# Patient Record
Sex: Male | Born: 1975 | Race: White | Hispanic: No | Marital: Married | Smoking: Never smoker
Health system: Southern US, Community
[De-identification: ages and names within clinical notes are randomized; demographics above are authoritative.]

## PROBLEM LIST (undated history)

## (undated) DIAGNOSIS — J45909 Unspecified asthma, uncomplicated: Secondary | ICD-10-CM

## (undated) DIAGNOSIS — R7303 Prediabetes: Secondary | ICD-10-CM

## (undated) DIAGNOSIS — R112 Nausea with vomiting, unspecified: Secondary | ICD-10-CM

## (undated) DIAGNOSIS — Z9889 Other specified postprocedural states: Secondary | ICD-10-CM

## (undated) DIAGNOSIS — N400 Enlarged prostate without lower urinary tract symptoms: Secondary | ICD-10-CM

## (undated) DIAGNOSIS — J3089 Other allergic rhinitis: Secondary | ICD-10-CM

## (undated) DIAGNOSIS — W57XXXA Bitten or stung by nonvenomous insect and other nonvenomous arthropods, initial encounter: Secondary | ICD-10-CM

## (undated) HISTORY — DX: Other allergic rhinitis: J30.89

## (undated) HISTORY — DX: Benign prostatic hyperplasia without lower urinary tract symptoms: N40.0

## (undated) HISTORY — DX: Prediabetes: R73.03

## (undated) HISTORY — PX: OTHER SURGICAL HISTORY: SHX169

## (undated) HISTORY — DX: Bitten or stung by nonvenomous insect and other nonvenomous arthropods, initial encounter: W57.XXXA

## (undated) HISTORY — DX: Unspecified asthma, uncomplicated: J45.909

## (undated) HISTORY — PX: SEPTOPLASTY: SHX2393

---

## 1996-08-10 HISTORY — PX: GANGLION CYST EXCISION: SHX1691

## 2001-02-21 ENCOUNTER — Emergency Department (HOSPITAL_COMMUNITY): Admission: EM | Admit: 2001-02-21 | Discharge: 2001-02-21 | Payer: Self-pay | Admitting: Emergency Medicine

## 2002-02-08 ENCOUNTER — Ambulatory Visit (HOSPITAL_COMMUNITY): Admission: RE | Admit: 2002-02-08 | Discharge: 2002-02-08 | Payer: Self-pay | Admitting: Specialist

## 2002-02-08 ENCOUNTER — Encounter: Payer: Self-pay | Admitting: Orthopaedic Surgery

## 2005-05-02 ENCOUNTER — Emergency Department (HOSPITAL_COMMUNITY): Admission: EM | Admit: 2005-05-02 | Discharge: 2005-05-02 | Payer: Self-pay | Admitting: Emergency Medicine

## 2005-06-02 ENCOUNTER — Ambulatory Visit: Payer: Self-pay | Admitting: Internal Medicine

## 2005-11-30 ENCOUNTER — Ambulatory Visit: Payer: Self-pay | Admitting: Internal Medicine

## 2006-06-03 ENCOUNTER — Ambulatory Visit: Payer: Self-pay | Admitting: Family Medicine

## 2006-06-03 ENCOUNTER — Encounter: Admission: RE | Admit: 2006-06-03 | Discharge: 2006-06-03 | Payer: Self-pay | Admitting: Family Medicine

## 2006-06-22 ENCOUNTER — Encounter: Admission: RE | Admit: 2006-06-22 | Discharge: 2006-06-22 | Payer: Self-pay | Admitting: Family Medicine

## 2007-06-20 ENCOUNTER — Ambulatory Visit: Payer: Self-pay | Admitting: Family Medicine

## 2007-06-20 DIAGNOSIS — S139XXA Sprain of joints and ligaments of unspecified parts of neck, initial encounter: Secondary | ICD-10-CM | POA: Insufficient documentation

## 2007-06-20 DIAGNOSIS — IMO0002 Reserved for concepts with insufficient information to code with codable children: Secondary | ICD-10-CM | POA: Insufficient documentation

## 2007-07-11 ENCOUNTER — Telehealth (INDEPENDENT_AMBULATORY_CARE_PROVIDER_SITE_OTHER): Payer: Self-pay | Admitting: *Deleted

## 2007-08-02 ENCOUNTER — Encounter (INDEPENDENT_AMBULATORY_CARE_PROVIDER_SITE_OTHER): Payer: Self-pay | Admitting: Family Medicine

## 2007-08-16 ENCOUNTER — Encounter: Admission: RE | Admit: 2007-08-16 | Discharge: 2007-08-16 | Payer: Self-pay | Admitting: Family Medicine

## 2007-08-18 ENCOUNTER — Telehealth (INDEPENDENT_AMBULATORY_CARE_PROVIDER_SITE_OTHER): Payer: Self-pay | Admitting: *Deleted

## 2007-08-26 ENCOUNTER — Encounter: Admission: RE | Admit: 2007-08-26 | Discharge: 2007-08-26 | Payer: Self-pay | Admitting: Family Medicine

## 2007-08-31 ENCOUNTER — Telehealth (INDEPENDENT_AMBULATORY_CARE_PROVIDER_SITE_OTHER): Payer: Self-pay | Admitting: *Deleted

## 2007-11-30 ENCOUNTER — Encounter: Admission: RE | Admit: 2007-11-30 | Discharge: 2007-11-30 | Payer: Self-pay | Admitting: Orthopedic Surgery

## 2008-11-08 DIAGNOSIS — W57XXXA Bitten or stung by nonvenomous insect and other nonvenomous arthropods, initial encounter: Secondary | ICD-10-CM

## 2008-11-08 HISTORY — DX: Bitten or stung by nonvenomous insect and other nonvenomous arthropods, initial encounter: W57.XXXA

## 2008-11-30 ENCOUNTER — Ambulatory Visit: Payer: Self-pay | Admitting: Family Medicine

## 2008-12-07 ENCOUNTER — Telehealth (INDEPENDENT_AMBULATORY_CARE_PROVIDER_SITE_OTHER): Payer: Self-pay | Admitting: *Deleted

## 2008-12-11 ENCOUNTER — Ambulatory Visit: Payer: Self-pay | Admitting: Family Medicine

## 2008-12-11 DIAGNOSIS — R209 Unspecified disturbances of skin sensation: Secondary | ICD-10-CM | POA: Insufficient documentation

## 2008-12-11 LAB — CONVERTED CEMR LAB
WBC Urine, dipstick: NEGATIVE
pH: 7.5

## 2008-12-14 ENCOUNTER — Ambulatory Visit: Payer: Self-pay | Admitting: Family Medicine

## 2008-12-18 ENCOUNTER — Encounter (INDEPENDENT_AMBULATORY_CARE_PROVIDER_SITE_OTHER): Payer: Self-pay | Admitting: *Deleted

## 2008-12-18 LAB — CONVERTED CEMR LAB
AST: 27 units/L (ref 0–37)
Alkaline Phosphatase: 46 units/L (ref 39–117)
BUN: 16 mg/dL (ref 6–23)
Basophils Absolute: 0 10*3/uL (ref 0.0–0.1)
Basophils Relative: 0.1 % (ref 0.0–3.0)
Bilirubin, Direct: 0 mg/dL (ref 0.0–0.3)
CO2: 31 meq/L (ref 19–32)
Calcium: 9.5 mg/dL (ref 8.4–10.5)
Chloride: 109 meq/L (ref 96–112)
Cholesterol: 144 mg/dL (ref 0–200)
Creatinine, Ser: 1.2 mg/dL (ref 0.4–1.5)
Eosinophils Relative: 6.6 % — ABNORMAL HIGH (ref 0.0–5.0)
GFR calc non Af Amer: 73.18 mL/min (ref >60)
Glucose, Bld: 105 mg/dL — ABNORMAL HIGH (ref 70–99)
Lymphs Abs: 1.3 10*3/uL (ref 0.7–4.0)
Monocytes Absolute: 0.5 10*3/uL (ref 0.1–1.0)
Monocytes Relative: 9 % (ref 3.0–12.0)
Neutro Abs: 3.5 10*3/uL (ref 1.4–7.7)
Neutrophils Relative %: 60.8 % (ref 43.0–77.0)
RBC: 5.28 M/uL (ref 4.22–5.81)
RDW: 11.9 % (ref 11.5–14.6)
Total Bilirubin: 1.3 mg/dL — ABNORMAL HIGH (ref 0.3–1.2)
Triglycerides: 58 mg/dL (ref 0.0–149.0)
VLDL: 11.6 mg/dL (ref 0.0–40.0)

## 2009-02-27 ENCOUNTER — Ambulatory Visit: Payer: Self-pay | Admitting: Internal Medicine

## 2009-03-20 ENCOUNTER — Ambulatory Visit: Payer: Self-pay | Admitting: Family Medicine

## 2009-03-20 DIAGNOSIS — M25519 Pain in unspecified shoulder: Secondary | ICD-10-CM

## 2009-07-03 ENCOUNTER — Ambulatory Visit: Payer: Self-pay | Admitting: Family

## 2009-07-03 DIAGNOSIS — J019 Acute sinusitis, unspecified: Secondary | ICD-10-CM

## 2009-10-03 ENCOUNTER — Telehealth (INDEPENDENT_AMBULATORY_CARE_PROVIDER_SITE_OTHER): Payer: Self-pay | Admitting: *Deleted

## 2009-10-03 ENCOUNTER — Ambulatory Visit (HOSPITAL_BASED_OUTPATIENT_CLINIC_OR_DEPARTMENT_OTHER): Admission: RE | Admit: 2009-10-03 | Discharge: 2009-10-03 | Payer: Self-pay | Admitting: Family Medicine

## 2009-10-03 ENCOUNTER — Ambulatory Visit: Payer: Self-pay | Admitting: Family Medicine

## 2009-10-03 ENCOUNTER — Ambulatory Visit: Payer: Self-pay | Admitting: Diagnostic Radiology

## 2009-10-03 DIAGNOSIS — L02519 Cutaneous abscess of unspecified hand: Secondary | ICD-10-CM

## 2009-10-03 DIAGNOSIS — L03119 Cellulitis of unspecified part of limb: Secondary | ICD-10-CM

## 2009-12-02 ENCOUNTER — Encounter: Payer: Self-pay | Admitting: Family Medicine

## 2009-12-02 ENCOUNTER — Encounter: Payer: Self-pay | Admitting: Infectious Diseases

## 2009-12-30 ENCOUNTER — Encounter: Admission: RE | Admit: 2009-12-30 | Discharge: 2009-12-30 | Payer: Self-pay | Admitting: Occupational Medicine

## 2009-12-30 ENCOUNTER — Ambulatory Visit (HOSPITAL_COMMUNITY): Admission: RE | Admit: 2009-12-30 | Discharge: 2009-12-30 | Payer: Self-pay | Admitting: Infectious Disease

## 2009-12-30 ENCOUNTER — Ambulatory Visit: Payer: Self-pay | Admitting: Infectious Diseases

## 2009-12-30 LAB — CONVERTED CEMR LAB
CRP: 0 mg/dL (ref <0.6)
Eosinophils Absolute: 0.2 10*3/uL (ref 0.0–0.7)
Eosinophils Relative: 2 % (ref 0–5)
HCT: 43.7 % (ref 39.0–52.0)
MCHC: 33.4 g/dL (ref 30.0–36.0)
MCV: 85.5 fL (ref 78.0–100.0)
Neutro Abs: 5.4 10*3/uL (ref 1.7–7.7)
Neutrophils Relative %: 70 % (ref 43–77)
RDW: 13.1 % (ref 11.5–15.5)
Sed Rate: 2 mm/hr (ref 0–16)

## 2010-08-31 ENCOUNTER — Encounter: Payer: Self-pay | Admitting: Orthopedic Surgery

## 2010-09-09 NOTE — Miscellaneous (Signed)
Summary: HIPAA Restrictions  HIPAA Restrictions   Imported By: Florinda Marker 12/31/2009 13:49:21  _____________________________________________________________________  External Attachment:    Type:   Image     Comment:   External Document

## 2010-09-09 NOTE — Consult Note (Signed)
Summary: [facility]  [facility]   Imported By: Lanelle Bal 12/17/2009 07:58:41  _____________________________________________________________________  External Attachment:    Type:   Image     Comment:   External Document
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
## 2010-09-09 NOTE — Progress Notes (Signed)
Summary: Imaging Results  Phone Note Outgoing Call   Call placed by: Army Fossa CMA,  October 03, 2009 5:11 PM Summary of Call: Imaging results,LMTCB:  foreign body still in hand---  referral for hand surgeon pending   Signed by Loreen Freud DO on 10/03/2009 at 5:08 PM  Follow-up for Phone Call        Pts aware. Army Fossa CMA  October 09, 2009 2:11 PM

## 2010-09-09 NOTE — Consult Note (Signed)
Summary: [facility]   Imported By: Florinda Marker 01/01/2010 14:01:48  _____________________________________________________________________  External Attachment:    Type:   Image     Comment:   External Document
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
## 2010-11-18 ENCOUNTER — Encounter: Payer: Self-pay | Admitting: *Deleted

## 2011-06-18 ENCOUNTER — Encounter: Payer: Self-pay | Admitting: Family Medicine

## 2011-06-19 ENCOUNTER — Ambulatory Visit (INDEPENDENT_AMBULATORY_CARE_PROVIDER_SITE_OTHER): Payer: 59 | Admitting: Family Medicine

## 2011-06-19 ENCOUNTER — Encounter: Payer: Self-pay | Admitting: Family Medicine

## 2011-06-19 DIAGNOSIS — R350 Frequency of micturition: Secondary | ICD-10-CM | POA: Insufficient documentation

## 2011-06-19 DIAGNOSIS — I10 Essential (primary) hypertension: Secondary | ICD-10-CM | POA: Insufficient documentation

## 2011-06-19 LAB — CBC WITH DIFFERENTIAL/PLATELET
Basophils Absolute: 0 10*3/uL (ref 0.0–0.1)
HCT: 45 % (ref 39.0–52.0)
Lymphs Abs: 1.7 10*3/uL (ref 0.7–4.0)
MCH: 29.8 pg (ref 26.0–34.0)
MCHC: 34.9 g/dL (ref 30.0–36.0)
MCV: 85.4 fL (ref 78.0–100.0)
Monocytes Absolute: 0.7 10*3/uL (ref 0.1–1.0)
Monocytes Relative: 8 % (ref 3–12)
Neutrophils Relative %: 65 % (ref 43–77)
RDW: 12.8 % (ref 11.5–15.5)

## 2011-06-19 LAB — TSH: TSH: 1.132 u[IU]/mL (ref 0.350–4.500)

## 2011-06-19 LAB — BASIC METABOLIC PANEL
Creat: 1.07 mg/dL (ref 0.50–1.35)
Potassium: 4 mEq/L (ref 3.5–5.3)
Sodium: 142 mEq/L (ref 135–145)

## 2011-06-19 MED ORDER — HYDROCHLOROTHIAZIDE 12.5 MG PO TABS: 12.5000 mg | ORAL_TABLET | Freq: Every day | ORAL | Status: DC

## 2011-06-19 NOTE — Progress Notes (Signed)
  Subjective:    Patient ID: Alexander Hernandez, male    DOB: [birth year], 35 y.o.   MRN: [id number]  HPI Elevated BP- highest SBP 154, highest DBP 102.  Reports pressure has been intermittently elevated x2 weeks.  Has stopped taking energy drinks x1 week.  Frequent HAs.  No CP, SOB, visual changes, edema.  + family hx- dad.  MGM died of MI at 64.  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    Urinary frequency- going often and small amounts.  + family hx of BPH.  sxs started 'a couple years ago'.  Urinating 2-4x/night.   Review of Systems For ROS see HPI     Objective:   Physical Exam  Vitals reviewed. Constitutional: He is oriented to person, place, and time. He appears well-developed and well-nourished. No distress.  HENT:  Head: Normocephalic and atraumatic.  Eyes: Conjunctivae and EOM are normal. Pupils are equal, round, and reactive to light.  Neck: Normal range of motion. Neck supple. No thyromegaly present.  Cardiovascular: Normal rate, regular rhythm, normal heart sounds and intact distal pulses.   No murmur heard. Pulmonary/Chest: Effort normal and breath sounds normal. No respiratory distress.  Abdominal: Soft. Bowel sounds are normal. He exhibits no distension.  Musculoskeletal: He exhibits no edema.  Lymphadenopathy:    He has no cervical adenopathy.  Neurological: He is alert and oriented to person, place, and time. No cranial nerve deficit.  Skin: Skin is warm and dry.  Psychiatric: He has a normal mood and affect. His behavior is normal.          Assessment & Plan:

## 2011-06-19 NOTE — Patient Instructions (Signed)
Follow up in 1 month to recheck BP Try and limit amount of sodium and caffeine in diet SLEEP!!!! Start the HCTZ daily We'll notify you of your lab results Someone will call you with your urology appt Sherri Rad in there!!!

## 2011-06-22 ENCOUNTER — Ambulatory Visit (INDEPENDENT_AMBULATORY_CARE_PROVIDER_SITE_OTHER): Payer: 59

## 2011-06-22 ENCOUNTER — Encounter: Payer: Self-pay | Admitting: *Deleted

## 2011-06-22 DIAGNOSIS — Z23 Encounter for immunization: Secondary | ICD-10-CM

## 2011-06-28 NOTE — Assessment & Plan Note (Signed)
May be related to excessive caffeine intake but given young age and nocturia will refer to Urology for complete evaluation.  Pt expressed understanding and is in agreement w/ plan.

## 2011-06-28 NOTE — Assessment & Plan Note (Signed)
Pt's BP has been elevated for >2 weeks w/ diastolic #s often in the 90s.  Check labs to r/o thyroid d/o, anemia, metabolic abnormality.  Based on family hx will start HCTZ and follow closely.  Reviewed lifestyle modifications.  Pt expressed understanding and is in agreement w/ plan.

## 2011-07-03 ENCOUNTER — Encounter: Payer: Self-pay | Admitting: Family Medicine

## 2011-07-20 ENCOUNTER — Encounter: Payer: Self-pay | Admitting: Family Medicine

## 2011-07-20 ENCOUNTER — Ambulatory Visit (INDEPENDENT_AMBULATORY_CARE_PROVIDER_SITE_OTHER): Payer: 59 | Admitting: Family Medicine

## 2011-07-20 DIAGNOSIS — J029 Acute pharyngitis, unspecified: Secondary | ICD-10-CM

## 2011-07-20 DIAGNOSIS — B353 Tinea pedis: Secondary | ICD-10-CM

## 2011-07-20 DIAGNOSIS — I10 Essential (primary) hypertension: Secondary | ICD-10-CM

## 2011-07-20 LAB — POCT RAPID STREP A (OFFICE): Rapid Strep A Screen: NEGATIVE

## 2011-07-20 NOTE — Assessment & Plan Note (Signed)
Start OTC Clotrimazole bid.  Encouraged him to keep feet clean and dry.  Reviewed supportive care and red flags that should prompt return.  Pt expressed understanding and is in agreement w/ plan.

## 2011-07-20 NOTE — Assessment & Plan Note (Signed)
New problem.  Pt tolerating meds w/out difficulty.  BP better controlled today.  Has made some lifestyle changes.  Check BMP.  Continue current meds for now.  Reviewed supportive care and red flags that should prompt return.  Pt expressed understanding and is in agreement w/ plan.

## 2011-07-20 NOTE — Progress Notes (Signed)
  Subjective:    Patient ID: Alexander Hernandez, male    DOB: 1976/06/14, 35 y.o.   MRN: [id number]  HPI HTN- new problem.  Started HCTZ at last visit.  BP much better today.  Increased sleep.  Notes increased BP w/ stress and lack of sleep.  No CP, SOB, HAs, visual changes, edema.  URI- sxs started 1 week ago w/ sore throat.  + nasal congestion, PND.  No ear pain.  Denies facial pain/pressure.  Minimal cough.  + sick contacts.  No fevers.  Fungal infxn- on feet during winter yearly.  Wears boots as a Company secretary.  Started changing socks mid day and this helps.  Wants to know what else can be done.   Review of Systems For ROS see HPI     Objective:   Physical Exam  Constitutional: He is oriented to person, place, and time. He appears well-developed and well-nourished. No distress.  HENT:  Head: Normocephalic and atraumatic.  Nose: Nose normal.       TMs normal bilaterally Pharynx erythematous w/ small patch of exudate No tonsillar enlargement  Eyes: Conjunctivae and EOM are normal. Pupils are equal, round, and reactive to light.  Neck: Normal range of motion. Neck supple. No thyromegaly present.  Cardiovascular: Normal rate, regular rhythm, normal heart sounds and intact distal pulses.   No murmur heard. Pulmonary/Chest: Effort normal and breath sounds normal. No respiratory distress.  Abdominal: Soft. Bowel sounds are normal. He exhibits no distension.  Musculoskeletal: He exhibits no edema.  Lymphadenopathy:    He has no cervical adenopathy.  Neurological: He is alert and oriented to person, place, and time. No cranial nerve deficit.  Skin: Skin is warm and dry.  Psychiatric: He has a normal mood and affect. His behavior is normal.          Assessment & Plan:

## 2011-07-20 NOTE — Patient Instructions (Signed)
Follow up in 3 months to discuss whether you need to continue BP meds Continue the HCTZ daily We'll notify you of your lab results Drink plenty of fluids Your strep test is negative so this is likely viral Ibuprofen as needed for sore throat Call with any questions or concerns Happy Holidays!

## 2011-07-20 NOTE — Assessment & Plan Note (Signed)
Rapid strep (-).  Most likely viral.  Reviewed supportive care and red flags that should prompt return.  Pt expressed understanding and is in agreement w/ plan.  

## 2011-07-21 LAB — BASIC METABOLIC PANEL
Calcium: 9.4 mg/dL (ref 8.4–10.5)
Glucose, Bld: 82 mg/dL (ref 70–99)
Potassium: 4.2 mEq/L (ref 3.5–5.1)

## 2011-07-30 ENCOUNTER — Encounter: Payer: Self-pay | Admitting: *Deleted

## 2011-11-07 ENCOUNTER — Encounter (HOSPITAL_COMMUNITY): Payer: Self-pay

## 2011-11-07 ENCOUNTER — Emergency Department (HOSPITAL_COMMUNITY)
Admission: EM | Admit: 2011-11-07 | Discharge: 2011-11-07 | Disposition: A | Discharge status: Nursing Home (Includes ICF) | Payer: 59 | Source: Home / Self Care | Attending: Family Medicine | Admitting: Family Medicine

## 2011-11-07 ENCOUNTER — Emergency Department (HOSPITAL_COMMUNITY): Payer: 59

## 2011-11-07 ENCOUNTER — Encounter (HOSPITAL_COMMUNITY): Payer: Self-pay | Admitting: Emergency Medicine

## 2011-11-07 ENCOUNTER — Emergency Department (HOSPITAL_COMMUNITY)
Admission: EM | Admit: 2011-11-07 | Discharge: 2011-11-07 | Disposition: A | Discharge status: Home / Self Care | Payer: 59 | Attending: Emergency Medicine | Admitting: Emergency Medicine

## 2011-11-07 DIAGNOSIS — Z23 Encounter for immunization: Secondary | ICD-10-CM | POA: Insufficient documentation

## 2011-11-07 DIAGNOSIS — F17200 Nicotine dependence, unspecified, uncomplicated: Secondary | ICD-10-CM | POA: Insufficient documentation

## 2011-11-07 DIAGNOSIS — W11XXXA Fall on and from ladder, initial encounter: Secondary | ICD-10-CM | POA: Insufficient documentation

## 2011-11-07 DIAGNOSIS — S01511A Laceration without foreign body of lip, initial encounter: Secondary | ICD-10-CM

## 2011-11-07 DIAGNOSIS — W19XXXA Unspecified fall, initial encounter: Secondary | ICD-10-CM

## 2011-11-07 DIAGNOSIS — S01501A Unspecified open wound of lip, initial encounter: Secondary | ICD-10-CM | POA: Insufficient documentation

## 2011-11-07 DIAGNOSIS — Y92009 Unspecified place in unspecified non-institutional (private) residence as the place of occurrence of the external cause: Secondary | ICD-10-CM | POA: Insufficient documentation

## 2011-11-07 DIAGNOSIS — M264 Malocclusion, unspecified: Secondary | ICD-10-CM

## 2011-11-07 DIAGNOSIS — S025XXA Fracture of tooth (traumatic), initial encounter for closed fracture: Secondary | ICD-10-CM

## 2011-11-07 DIAGNOSIS — R7309 Other abnormal glucose: Secondary | ICD-10-CM | POA: Insufficient documentation

## 2011-11-07 MED ORDER — DIPHENHYDRAMINE HCL 25 MG PO CAPS
25.0000 mg | ORAL_CAPSULE | Freq: Once | ORAL | Status: AC
  Administered 2011-11-07: 25 mg via ORAL
  Filled 2011-11-07: qty 1

## 2011-11-07 MED ORDER — TETANUS-DIPHTH-ACELL PERTUSSIS 5-2.5-18.5 LF-MCG/0.5 IM SUSP: 0.5000 mL | Freq: Once | INTRAMUSCULAR | Status: DC

## 2011-11-07 MED ORDER — AMOXICILLIN 500 MG PO CAPS: 500.0000 mg | ORAL_CAPSULE | Freq: Three times a day (TID) | ORAL | Status: AC

## 2011-11-07 MED ORDER — IBUPROFEN 800 MG PO TABS: 800.0000 mg | ORAL_TABLET | Freq: Four times a day (QID) | ORAL | Status: DC | PRN

## 2011-11-07 MED ORDER — TRAMADOL HCL 50 MG PO TABS
50.0000 mg | ORAL_TABLET | Freq: Once | ORAL | Status: AC
  Administered 2011-11-07: 50 mg via ORAL
  Filled 2011-11-07: qty 1

## 2011-11-07 MED ORDER — ACETAMINOPHEN 325 MG PO TABS
975.0000 mg | ORAL_TABLET | Freq: Once | ORAL | Status: AC
  Administered 2011-11-07: 975 mg via ORAL
  Filled 2011-11-07: qty 3

## 2011-11-07 MED ORDER — TETANUS-DIPHTH-ACELL PERTUSSIS 5-2.5-18.5 LF-MCG/0.5 IM SUSP
0.5000 mL | Freq: Once | INTRAMUSCULAR | Status: AC
  Administered 2011-11-07: 0.5 mL via INTRAMUSCULAR
  Filled 2011-11-07: qty 0.5
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
## 2011-11-07 NOTE — ED Notes (Signed)
PT. PRESENTS WITH LOWER LIP LACERATION , FELL FROM LADDER THIS AFTERNOON , DENIES LOC , AMBULATORY , ALSO STATES HARD TO CLOSE MOUTH/  UNSURE IF HE BROKE ANY TEETH.

## 2011-11-07 NOTE — ED Notes (Signed)
Pt fell 6 feet from a ladder one hour ago and landed on face, laceration to lower lip, chipped upper tooth and unable to close mouth d/t repositioning of teeth.  No loc.

## 2011-11-07 NOTE — Discharge Instructions (Signed)
Please call the dentist listed above first thing Monday morning to schedule a close follow up appointment.  Take tylenol and ibuprofen as needed for pain.  Please keep your lip clean and dry.  Take the antibiotic to prevent infection.  Please see your doctor, the urgent care, or return to the ER in 3-5 days for a wound recheck and suture removal.   Return to the ER immediately if you develop redness, swelling, pus draining from the wound, or fevers greater than 100.4.  You may return to the ER at any time for worsening condition or any new symptoms that concern you.   Dental Fracture You have a dental fracture or injury. This can mean the tooth is loose, has a chip in the enamel or is broken. If just the outer enamel is chipped, there is a good chance the tooth will not become infected. The only treatment needed may be to smooth off a rough edge. Fractures into the deeper layers (dentin and pulp) cause greater pain and are more likely to become infected. These require you to see a dentist as soon as possible to save the tooth. Loose teeth may need to be wired or bonded with a plastic splint to hold them in place. A paste may be painted on the open area of the broken tooth to reduce the pain. Antibiotics and pain medicine may be prescribed. Choosing a soft or liquid diet and rinsing the mouth out with warm water after meals may be helpful. See your dentist as recommended. Failure to seek care or follow up with a dentist or other specialist as recommended could result in the loss of your tooth, infection, or permanent dental problems. SEEK MEDICAL CARE IF:   You have increased pain not controlled with medicines.   You have swelling around the tooth, in the face or neck.   You have bleeding which starts, continues, or gets worse.   You have a fever.  Document Released: 09/03/2004 Document Revised: 07/16/2011 Document Reviewed: 06/18/2009 [facility] Patient Information 2012 [place], [place].  Facial  Laceration A facial laceration is a cut on the face. Lacerations usually heal quickly, but they need special care to reduce scarring. It will take 1 to 2 years for the scar to lose its redness and to heal completely. TREATMENT  Some facial lacerations may not require closure. Some lacerations may not be able to be closed due to an increased risk of infection. It is important to see your caregiver as soon as possible after an injury to minimize the risk of infection and to maximize the opportunity for successful closure. If closure is appropriate, pain medicines may be given, if needed. The wound will be cleaned to help prevent infection. Your caregiver will use stitches (sutures), staples, wound glue (adhesive), or skin adhesive strips to repair the laceration. These tools bring the skin edges together to allow for faster healing and a better cosmetic outcome. However, all wounds will heal with a scar.  Once the wound has healed, scarring can be minimized by covering the wound with sunscreen during the day for 1 full year. Use a sunscreen with an SPF of at least 30. Sunscreen helps to reduce the pigment that will form in the scar. When applying sunscreen to a completely healed wound, massage the scar for a few minutes to help reduce the appearance of the scar. Use circular motions with your fingertips, on and around the scar. Do not massage a healing wound. HOME CARE INSTRUCTIONS For sutures:  Keep the wound clean and dry.   If you were given a bandage (dressing), you should change it at least once a day. Also change the dressing if it becomes wet or dirty, or as directed by your caregiver.   Wash the wound with soap and water 2 times a day. Rinse the wound off with water to remove all soap. Pat the wound dry with a clean towel.   After cleaning, apply a thin layer of the antibiotic ointment recommended by your caregiver. This will help prevent infection and keep the dressing from sticking.   You  may shower as usual after the first 24 hours. Do not soak the wound in water until the sutures are removed.   Only take over-the-counter or prescription medicines for pain, discomfort, or fever as directed by your caregiver.   Get your sutures removed as directed by your caregiver. With facial lacerations, sutures should usually be taken out after 4 to 5 days to avoid stitch marks.   Wait a few days after your sutures are removed before applying makeup.  For skin adhesive strips:  Keep the wound clean and dry.   Do not get the skin adhesive strips wet. You may bathe carefully, using caution to keep the wound dry.   If the wound gets wet, pat it dry with a clean towel.   Skin adhesive strips will fall off on their own. You may trim the strips as the wound heals. Do not remove skin adhesive strips that are still stuck to the wound. They will fall off in time.  For wound adhesive:  You may briefly wet your wound in the shower or bath. Do not soak or scrub the wound. Do not swim. Avoid periods of heavy perspiration until the skin adhesive has fallen off on its own. After showering or bathing, gently pat the wound dry with a clean towel.   Do not apply liquid medicine, cream medicine, ointment medicine, or makeup to your wound while the skin adhesive is in place. This may loosen the film before your wound is healed.   If a dressing is placed over the wound, be careful not to apply tape directly over the skin adhesive. This may cause the adhesive to be pulled off before the wound is healed.   Avoid prolonged exposure to sunlight or tanning lamps while the skin adhesive is in place. Exposure to ultraviolet light in the first year will darken the scar.   The skin adhesive will usually remain in place for 5 to 10 days, then naturally fall off the skin. Do not pick at the adhesive film.  You may need a tetanus shot if:  You cannot remember when you had your last tetanus shot.   You have never  had a tetanus shot.  If you get a tetanus shot, your arm may swell, get red, and feel warm to the touch. This is common and not a problem. If you need a tetanus shot and you choose not to have one, there is a rare chance of getting tetanus. Sickness from tetanus can be serious. SEEK IMMEDIATE MEDICAL CARE IF:  You develop redness, pain, or swelling around the wound.   There is yellowish-white fluid (pus) coming from the wound.   You develop chills or a fever.  MAKE SURE YOU:  Understand these instructions.   Will watch your condition.   Will get help right away if you are not doing well or get worse.  Document Released: 09/03/2004 Document  Revised: 07/16/2011 Document Reviewed: 01/19/2011 [facility] Patient Information 2012 [place], [place].  Mouth Injury Cuts and scrapes inside the mouth are common from falls or bites. They tend to bleed a lot. Most mouth injuries heal quickly.  HOME CARE  See your dentist right away if teeth are broken. Take all broken pieces with you to the dentist.   Press on the bleeding site with a germ free (sterile) gauze or piece of clean cloth. This will help stop the bleeding.   Cold drinks or ice will help keep the puffiness (swelling) down.   Gargle with warm salt water after 1 day. Put 1 teaspoon of salt into 1 cup of warm water.   Only take medicine as told by your doctor.   Eat soft foods until healing is complete.   Avoid any salty or citrus foods. They may sting your mouth.   Rinse your mouth with warm water after meals.  GET HELP RIGHT AWAY IF:   You have a large amount of bleeding that will not stop.   You have severe pain.   You have trouble swallowing.   Your mouth becomes infected.   You have a fever.  MAKE SURE YOU:   Understand these instructions.   Will watch your condition.   Will get help right away if you are not doing well or get worse.  Document Released: 10/21/2009 Document Revised: 07/16/2011 Document Reviewed:  10/21/2009 [facility] Patient Information 2012 [place], [place].
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
## 2011-11-07 NOTE — ED Notes (Signed)
Suture cart moved to room. Provided pt with sterile gauze soaked with saline to apply to affected lip.

## 2011-11-07 NOTE — ED Notes (Signed)
Pt c/o itchy red rash to upper abdomen and mid upper back. Advised PA.

## 2011-11-07 NOTE — ED Notes (Signed)
Pt has contacted his dentist, Chestine Spore who is out of town, she can be reached in [phone] if she needs to be consulted

## 2011-11-13 ENCOUNTER — Encounter: Payer: Self-pay | Admitting: Family Medicine

## 2011-11-13 ENCOUNTER — Ambulatory Visit (INDEPENDENT_AMBULATORY_CARE_PROVIDER_SITE_OTHER): Payer: 59 | Admitting: Family Medicine

## 2011-11-13 VITALS — BP 124/82 | HR 60 | Temp 97.6°F | Ht 69.0 in | Wt 192.0 lb

## 2011-11-13 DIAGNOSIS — S01511A Laceration without foreign body of lip, initial encounter: Secondary | ICD-10-CM | POA: Insufficient documentation

## 2011-11-13 DIAGNOSIS — S01501A Unspecified open wound of lip, initial encounter: Secondary | ICD-10-CM

## 2011-11-13 NOTE — Progress Notes (Signed)
  Subjective:    Patient ID: Alexander Hernandez, male    DOB: 26-Mar-1976, 36 y.o.   MRN: [id number]  HPI Suture removal.  Pt had 6 stiches placed on lower lip on Saturday.  Due for removal.  No pain.  No drainage.  Thick scab on lip   Review of Systems For ROS see HPI     Objective:   Physical Exam  HENT:  Mouth/Throat: Lacerations (well healing, thick scab present.  6 sutures.) present.            Assessment & Plan:

## 2011-11-13 NOTE — Assessment & Plan Note (Signed)
New.  Pt seen in ER and sutured.  Due for removal.  Lip well healing.  Pt tolerated procedure w/out difficulty.

## 2011-11-13 NOTE — Patient Instructions (Signed)
Keep lip clean and dry Some oozing and bleeding is to be expected DON'T PICK THE SCAB! Hang in there!

## 2011-11-15 NOTE — ED Provider Notes (Signed)
Evaluation and management procedures were performed by the PA/NP/Resident Physician under my supervision/collaboration.   Alvaro Aungst D Natilee Gauer, MD 11/15/11 1556 

## 2012-06-04 DIAGNOSIS — F17200 Nicotine dependence, unspecified, uncomplicated: Secondary | ICD-10-CM | POA: Insufficient documentation

## 2012-06-04 DIAGNOSIS — S335XXA Sprain of ligaments of lumbar spine, initial encounter: Secondary | ICD-10-CM | POA: Insufficient documentation

## 2012-06-04 DIAGNOSIS — W1809XA Striking against other object with subsequent fall, initial encounter: Secondary | ICD-10-CM | POA: Insufficient documentation

## 2012-06-04 DIAGNOSIS — S022XXA Fracture of nasal bones, initial encounter for closed fracture: Secondary | ICD-10-CM | POA: Insufficient documentation

## 2012-06-04 DIAGNOSIS — R7309 Other abnormal glucose: Secondary | ICD-10-CM | POA: Insufficient documentation

## 2012-06-04 DIAGNOSIS — Z79899 Other long term (current) drug therapy: Secondary | ICD-10-CM | POA: Insufficient documentation

## 2012-06-04 DIAGNOSIS — Y9289 Other specified places as the place of occurrence of the external cause: Secondary | ICD-10-CM | POA: Insufficient documentation

## 2012-06-04 DIAGNOSIS — Y9301 Activity, walking, marching and hiking: Secondary | ICD-10-CM | POA: Insufficient documentation

## 2012-06-05 ENCOUNTER — Encounter (HOSPITAL_BASED_OUTPATIENT_CLINIC_OR_DEPARTMENT_OTHER): Payer: Self-pay | Admitting: *Deleted

## 2012-06-05 ENCOUNTER — Emergency Department (HOSPITAL_BASED_OUTPATIENT_CLINIC_OR_DEPARTMENT_OTHER)
Admission: EM | Admit: 2012-06-05 | Discharge: 2012-06-05 | Disposition: A | Discharge status: Home / Self Care | Payer: 59 | Attending: Emergency Medicine | Admitting: Emergency Medicine

## 2012-06-05 ENCOUNTER — Emergency Department (HOSPITAL_BASED_OUTPATIENT_CLINIC_OR_DEPARTMENT_OTHER): Payer: 59

## 2012-06-05 DIAGNOSIS — S39012A Strain of muscle, fascia and tendon of lower back, initial encounter: Secondary | ICD-10-CM

## 2012-06-05 DIAGNOSIS — S022XXA Fracture of nasal bones, initial encounter for closed fracture: Secondary | ICD-10-CM

## 2012-06-05 MED ORDER — IBUPROFEN 800 MG PO TABS: 800.0000 mg | ORAL_TABLET | Freq: Three times a day (TID) | ORAL | Status: DC

## 2012-06-05 MED ORDER — OXYMETAZOLINE HCL 0.05 % NA SOLN
2.0000 | Freq: Two times a day (BID) | NASAL | Status: DC
  Administered 2012-06-05: 2 via NASAL
  Filled 2012-06-05: qty 15

## 2012-06-05 MED ORDER — LORATADINE-PSEUDOEPHEDRINE ER 5-120 MG PO TB12: 1.0000 | ORAL_TABLET | Freq: Two times a day (BID) | ORAL | Status: DC
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
## 2012-06-05 NOTE — ED Notes (Addendum)
Returned from xray

## 2012-06-05 NOTE — ED Notes (Signed)
Pt was running anf fell into log in the woods pt hit his nose pt with abrasion to bridge of nose and deformity

## 2013-07-18 ENCOUNTER — Encounter (HOSPITAL_COMMUNITY): Payer: Self-pay | Admitting: Emergency Medicine

## 2013-07-18 DIAGNOSIS — R61 Generalized hyperhidrosis: Secondary | ICD-10-CM | POA: Insufficient documentation

## 2013-07-18 DIAGNOSIS — R0789 Other chest pain: Secondary | ICD-10-CM | POA: Insufficient documentation

## 2013-07-18 DIAGNOSIS — I10 Essential (primary) hypertension: Secondary | ICD-10-CM | POA: Insufficient documentation

## 2013-07-18 DIAGNOSIS — F17200 Nicotine dependence, unspecified, uncomplicated: Secondary | ICD-10-CM | POA: Insufficient documentation

## 2013-07-18 DIAGNOSIS — Z8709 Personal history of other diseases of the respiratory system: Secondary | ICD-10-CM | POA: Insufficient documentation

## 2013-07-18 DIAGNOSIS — Z79899 Other long term (current) drug therapy: Secondary | ICD-10-CM | POA: Insufficient documentation

## 2013-07-18 LAB — COMPREHENSIVE METABOLIC PANEL
ALT: 26 U/L (ref 0–53)
AST: 25 U/L (ref 0–37)
Albumin: 4.2 g/dL (ref 3.5–5.2)
CO2: 27 mEq/L (ref 19–32)
Chloride: 107 mEq/L (ref 96–112)
Creatinine, Ser: 0.99 mg/dL (ref 0.50–1.35)
GFR calc non Af Amer: >90 mL/min (ref >90)
Potassium: 4.6 mEq/L (ref 3.5–5.1)
Sodium: 142 mEq/L (ref 135–145)
Total Bilirubin: 0.6 mg/dL (ref 0.3–1.2)

## 2013-07-18 LAB — CBC WITH DIFFERENTIAL/PLATELET
Basophils Absolute: 0 10*3/uL (ref 0.0–0.1)
Basophils Relative: 0 % (ref 0–1)
HCT: 45.7 % (ref 39.0–52.0)
Lymphocytes Relative: 17 % (ref 12–46)
MCHC: 35.2 g/dL (ref 30.0–36.0)
Monocytes Absolute: 0.9 10*3/uL (ref 0.1–1.0)
Neutro Abs: 6.4 10*3/uL (ref 1.7–7.7)
Neutrophils Relative %: 68 % (ref 43–77)
Platelets: 246 10*3/uL (ref 150–400)
RDW: 12.8 % (ref 11.5–15.5)
WBC: 9.4 10*3/uL (ref 4.0–10.5)

## 2013-07-18 LAB — POCT I-STAT TROPONIN I
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
## 2013-07-19 ENCOUNTER — Emergency Department (HOSPITAL_COMMUNITY)
Admission: EM | Admit: 2013-07-19 | Discharge: 2013-07-19 | Disposition: A | Discharge status: Home / Self Care | Payer: 59 | Attending: Emergency Medicine | Admitting: Emergency Medicine

## 2013-07-19 DIAGNOSIS — R079 Chest pain, unspecified: Secondary | ICD-10-CM

## 2013-07-19 MED ORDER — RANITIDINE HCL 150 MG PO CAPS: 150.0000 mg | ORAL_CAPSULE | Freq: Every day | ORAL | Status: DC
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
## 2013-11-24 ENCOUNTER — Encounter: Payer: Self-pay | Admitting: Family Medicine

## 2013-11-24 ENCOUNTER — Ambulatory Visit (INDEPENDENT_AMBULATORY_CARE_PROVIDER_SITE_OTHER): Payer: 59 | Admitting: Family Medicine

## 2013-11-24 VITALS — BP 120/76 | HR 72 | Temp 98.0°F | Resp 16 | Wt 190.4 lb

## 2013-11-24 DIAGNOSIS — M25552 Pain in left hip: Secondary | ICD-10-CM | POA: Insufficient documentation

## 2013-11-24 DIAGNOSIS — IMO0001 Reserved for inherently not codable concepts without codable children: Secondary | ICD-10-CM

## 2013-11-24 DIAGNOSIS — R030 Elevated blood-pressure reading, without diagnosis of hypertension: Secondary | ICD-10-CM

## 2013-11-24 DIAGNOSIS — M25559 Pain in unspecified hip: Secondary | ICD-10-CM

## 2013-11-24 MED ORDER — TRAMADOL HCL 50 MG PO TABS: 50.0000 mg | ORAL_TABLET | Freq: Three times a day (TID) | ORAL | Status: DC | PRN

## 2013-11-24 MED ORDER — MELOXICAM 15 MG PO TABS: 15.0000 mg | ORAL_TABLET | Freq: Every day | ORAL | Status: DC

## 2013-11-24 NOTE — Assessment & Plan Note (Signed)
New.  Pt reports this is episodic and associated w/ feeling very poorly.  Had negative ER w/u for CP in Dec 2014.  Encouraged pt to call for an appt next time this occurs so he can have complete w/u.  Pt expressed understanding and is in agreement w/ plan.

## 2013-11-24 NOTE — Progress Notes (Signed)
Pre visit review using our clinic review tool, if applicable. No additional management support is needed unless otherwise documented below in the visit note. 

## 2013-11-24 NOTE — Patient Instructions (Signed)
Schedule your complete physical at your convenience Start the Meloxicam daily for inflammation Use the Tramadol for pain at night (when not on shift) We'll call you with your ortho appt If you have another episode of elevated BP or other concerns, please call! Hang in there!

## 2013-11-24 NOTE — Assessment & Plan Note (Signed)
New.  Pt w/ very limited internal and external rotation of L hip.  Start daily NSAIDs.  Refer to ortho.  Reviewed supportive care and red flags that should prompt return.  Pt expressed understanding and is in agreement w/ plan.

## 2013-11-24 NOTE — Progress Notes (Signed)
   Subjective:    Patient ID: Alexander Hernandez, male    DOB: [birth year], 38 y.o.   MRN: [id number]  HPI Hip pain- L hip, has progressed to the point it's 'constantly there'.  sxs started 6-8 months ago.  Pain is 'deep in the groin'.  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  Initially 'popped' and has been painful since.  Denies grinding or crepitus.  Difficulty getting comfortable to sleep.  Some mild improvement w/ NSAIDs but not relief.  Elevated BP- pt reports he has had 2-3 episodes in the last few months where BP will 'shoot up', he'll get red in the face, 'feel like crap'.  1 time he had associated CP and went to ER- had normal w/u.  Currently asymptomatic.   Review of Systems For ROS see HPI     Objective:   Physical Exam  Vitals reviewed. Constitutional: He is oriented to person, place, and time. He appears well-developed and well-nourished. No distress.  HENT:  Head: Normocephalic and atraumatic.  Cardiovascular: Normal rate, regular rhythm, normal heart sounds and intact distal pulses.   Pulmonary/Chest: Effort normal and breath sounds normal. No respiratory distress. He has no wheezes. He has no rales.  Musculoskeletal:  Good hip flexion on L Very limited internal/external rotation of L hip  Neurological: He is alert and oriented to person, place, and time.  Skin: Skin is warm and dry.  Psychiatric: He has a normal mood and affect. His behavior is normal. Thought content normal.          Assessment & Plan:

## 2014-01-27 ENCOUNTER — Emergency Department (INDEPENDENT_AMBULATORY_CARE_PROVIDER_SITE_OTHER): Payer: Worker's Compensation

## 2014-01-27 ENCOUNTER — Emergency Department (INDEPENDENT_AMBULATORY_CARE_PROVIDER_SITE_OTHER)
Admission: EM | Admit: 2014-01-27 | Discharge: 2014-01-27 | Disposition: A | Discharge status: Home / Self Care | Payer: Worker's Compensation | Source: Home / Self Care | Attending: Family Medicine | Admitting: Family Medicine

## 2014-01-27 ENCOUNTER — Encounter (HOSPITAL_COMMUNITY): Payer: Self-pay | Admitting: Emergency Medicine

## 2014-01-27 DIAGNOSIS — X58XXXA Exposure to other specified factors, initial encounter: Secondary | ICD-10-CM

## 2014-01-27 DIAGNOSIS — S62639A Displaced fracture of distal phalanx of unspecified finger, initial encounter for closed fracture: Secondary | ICD-10-CM

## 2014-01-27 MED ORDER — TETANUS-DIPHTH-ACELL PERTUSSIS 5-2.5-18.5 LF-MCG/0.5 IM SUSP
0.5000 mL | Freq: Once | INTRAMUSCULAR | Status: AC
  Administered 2014-01-27: 0.5 mL via INTRAMUSCULAR

## 2014-01-27 MED ORDER — IBUPROFEN 800 MG PO TABS: 800.0000 mg | ORAL_TABLET | Freq: Three times a day (TID) | ORAL | Status: DC

## 2014-01-27 MED ORDER — TETANUS-DIPHTH-ACELL PERTUSSIS 5-2.5-18.5 LF-MCG/0.5 IM SUSP
INTRAMUSCULAR | Status: AC
  Filled 2014-01-27: qty 0.5

## 2014-01-27 NOTE — Discharge Instructions (Signed)
Ice, ibuprofen, splint as needed, return to [facility] if further problems, you had a tetanus booster.
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
## 2014-01-27 NOTE — ED Notes (Signed)
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

## 2014-07-18 ENCOUNTER — Encounter: Payer: Self-pay | Admitting: Family Medicine

## 2014-07-18 ENCOUNTER — Ambulatory Visit (INDEPENDENT_AMBULATORY_CARE_PROVIDER_SITE_OTHER): Payer: 59 | Admitting: Family Medicine

## 2014-07-18 ENCOUNTER — Ambulatory Visit (INDEPENDENT_AMBULATORY_CARE_PROVIDER_SITE_OTHER): Payer: 59 | Admitting: General Practice

## 2014-07-18 ENCOUNTER — Encounter: Payer: Self-pay | Admitting: Gastroenterology

## 2014-07-18 VITALS — BP 140/84 | HR 82 | Temp 98.3°F | Resp 16 | Wt 190.2 lb

## 2014-07-18 DIAGNOSIS — Z23 Encounter for immunization: Secondary | ICD-10-CM

## 2014-07-18 DIAGNOSIS — J309 Allergic rhinitis, unspecified: Secondary | ICD-10-CM

## 2014-07-18 DIAGNOSIS — R1314 Dysphagia, pharyngoesophageal phase: Secondary | ICD-10-CM | POA: Insufficient documentation

## 2014-07-18 DIAGNOSIS — J3089 Other allergic rhinitis: Secondary | ICD-10-CM

## 2014-07-18 DIAGNOSIS — J302 Other seasonal allergic rhinitis: Secondary | ICD-10-CM | POA: Insufficient documentation

## 2014-07-18 MED ORDER — OMEPRAZOLE 40 MG PO CPDR: 40.0000 mg | DELAYED_RELEASE_CAPSULE | Freq: Every day | ORAL | Status: DC

## 2014-07-18 MED ORDER — MONTELUKAST SODIUM 10 MG PO TABS: 10.0000 mg | ORAL_TABLET | Freq: Every day | ORAL | Status: DC

## 2014-07-18 MED ORDER — AZELASTINE-FLUTICASONE 137-50 MCG/ACT NA SUSP: 1.0000 | Freq: Two times a day (BID) | NASAL | Status: DC

## 2014-07-18 NOTE — Progress Notes (Signed)
Pre visit review using our clinic review tool, if applicable. No additional management support is needed unless otherwise documented below in the visit note. 

## 2014-07-18 NOTE — Assessment & Plan Note (Signed)
New.  Pt is having difficulty regardless of how big a bite or how much he chews food.  Will start PPI in case of GERD induced inflammation.  Refer to GI for complete evaluation.  Pt expressed understanding and is in agreement w/ plan.

## 2014-07-18 NOTE — Patient Instructions (Signed)
Follow up as needed We'll call you with your allergy and GI referrals Continue Claritin or Zyrtec daily Add the Singulair daily Use the nasal spray- 1 spray each nostril twice daily Start the Omeprazole daily to decrease acid reflux Call with any questions or concerns Hang in there! Happy Holidays!

## 2014-07-18 NOTE — Assessment & Plan Note (Signed)
Deteriorated.  Pt is having severe allergy sxs w/o evidence of infxn.  Previously required allergy shots.  Will refer back to allergist for testing.  In the meantime, for symptom control will add Dymista and Singulair.  Continue OTC antihistamine.  Reviewed supportive care and red flags that should prompt return.  Pt expressed understanding and is in agreement w/ plan.

## 2014-07-18 NOTE — Progress Notes (Signed)
   Subjective:    Patient ID: Alexander Hernandez, male    DOB: May 08, 1976, 38 y.o.   MRN: [id number]  HPI Sinus issues- 'my allergies have really kind of kicked my ass in the last 3 months'.  Taking 'a lot' of OTC meds- Claritin, Zyrtec D.  Pt reports sneezing upward of 300x/day.  Constant rhinorrhea.  Pt was previously on allergy shots (10-15 yrs ago).  Pt reports hx of multiple allergies.  Using Afrin as 'last resort'.  Not using nasal steroid spray regularly.    Choking episodes- occuring w/ food, particularly bread.  Having to take very small bites and chew food thoroughly but still having 'scary choking spells'.  Review of Systems For ROS see HPI     Objective:   Physical Exam  Constitutional: He appears well-developed and well-nourished. No distress.  HENT:  Head: Normocephalic and atraumatic.  No TTP over sinuses + turbinate edema + PND TMs normal bilaterally  Eyes: Conjunctivae and EOM are normal. Pupils are equal, round, and reactive to light.  Neck: Normal range of motion. Neck supple.  Cardiovascular: Normal rate, regular rhythm and normal heart sounds.   Pulmonary/Chest: Effort normal and breath sounds normal. No respiratory distress. He has no wheezes.  Lymphadenopathy:    He has no cervical adenopathy.  Skin: Skin is warm and dry.  Vitals reviewed.         Assessment & Plan:

## 2014-07-20 ENCOUNTER — Other Ambulatory Visit: Payer: Self-pay | Admitting: General Practice

## 2014-07-20 MED ORDER — FLUTICASONE PROPIONATE 50 MCG/ACT NA SUSP: 1.0000 | Freq: Two times a day (BID) | NASAL | Status: DC | PRN

## 2014-07-27 ENCOUNTER — Encounter: Payer: Self-pay | Admitting: Family Medicine

## 2014-09-07 ENCOUNTER — Ambulatory Visit: Payer: Self-pay | Admitting: Gastroenterology

## 2014-09-10 ENCOUNTER — Encounter (INDEPENDENT_AMBULATORY_CARE_PROVIDER_SITE_OTHER): Payer: Self-pay

## 2014-09-10 ENCOUNTER — Other Ambulatory Visit: Payer: 59

## 2014-09-10 ENCOUNTER — Ambulatory Visit (INDEPENDENT_AMBULATORY_CARE_PROVIDER_SITE_OTHER): Payer: 59 | Admitting: Internal Medicine

## 2014-09-10 ENCOUNTER — Encounter: Payer: Self-pay | Admitting: Internal Medicine

## 2014-09-10 VITALS — BP 110/60 | HR 64 | Ht 69.0 in | Wt 188.0 lb

## 2014-09-10 DIAGNOSIS — J3089 Other allergic rhinitis: Principal | ICD-10-CM

## 2014-09-10 DIAGNOSIS — J302 Other seasonal allergic rhinitis: Secondary | ICD-10-CM

## 2014-09-10 DIAGNOSIS — J309 Allergic rhinitis, unspecified: Secondary | ICD-10-CM

## 2014-09-10 MED ORDER — AZELASTINE-FLUTICASONE 137-50 MCG/ACT NA SUSP: 2.0000 | Freq: Every day | NASAL | Status: DC

## 2014-09-10 MED ORDER — AZELASTINE-FLUTICASONE 137-50 MCG/ACT NA SUSP: 1.0000 | Freq: Every day | NASAL | Status: DC

## 2014-09-10 NOTE — Patient Instructions (Signed)
Order- lab- Allergy Profile   Dx seasonal and perennial allergic rhinitis  Sample Dymista nasal spray   1-2 puffs each nostril,once daily at bedtime           Try this intead of Nasacort  Script for Dymista to use if you like the sample
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
## 2014-09-11 LAB — ALLERGY FULL PROFILE
ALLERGEN, D PTERNOYSSINUS, D1: 1.86 kU/L — AB
Allergen,Goose feathers, e70: <0.1 kU/L
BOX ELDER: 0.2 kU/L — AB
Bahia Grass: <0.1 kU/L
Bermuda Grass: <0.1 kU/L
Candida Albicans: 0.85 kU/L — ABNORMAL HIGH
Cat Dander: 1.19 kU/L — ABNORMAL HIGH
Common Ragweed: 0.19 kU/L — ABNORMAL HIGH
D. farinae: 1.9 kU/L — ABNORMAL HIGH
DOG DANDER: 0.87 kU/L — AB
Elm IgE: <0.1 kU/L
G009 Red Top: <0.1 kU/L
Goldenrod: <0.1 kU/L
HOUSE DUST HOLLISTER: 0.62 kU/L — AB
IGE (IMMUNOGLOBULIN E), SERUM: 282 kU/L — AB (ref <115)
Lamb's Quarters: <0.1 kU/L
Oak: <0.1 kU/L
Stemphylium Botryosum: <0.1 kU/L
Timothy Grass: <0.1 kU/L

## 2014-09-11 NOTE — Assessment & Plan Note (Signed)
Plan-environmental precautions, sample Dymista nasal spray, lab for allergy profile

## 2014-09-12 ENCOUNTER — Encounter: Payer: Self-pay | Admitting: Gastroenterology

## 2014-09-12 ENCOUNTER — Ambulatory Visit (INDEPENDENT_AMBULATORY_CARE_PROVIDER_SITE_OTHER): Payer: 59 | Admitting: Gastroenterology

## 2014-09-12 VITALS — BP 120/82 | HR 84 | Ht 68.0 in | Wt 191.2 lb

## 2014-09-12 DIAGNOSIS — R1314 Dysphagia, pharyngoesophageal phase: Secondary | ICD-10-CM

## 2014-09-12 DIAGNOSIS — K219 Gastro-esophageal reflux disease without esophagitis: Secondary | ICD-10-CM
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
## 2014-09-12 NOTE — Patient Instructions (Signed)
You have been scheduled for an endoscopy. Please follow written instructions given to you at your visit today. If you use inhalers (even only as needed), please bring them with you on the day of your procedure. Your physician has requested that you go to www.startemmi.com and enter the access code given to you at your visit today. This web site gives a general overview about your procedure. However, you should still follow specific instructions given to you by our office regarding your preparation for the procedure.  Today you have been given anti-reflux measures handout to read and follow.   CC: Dr. Annye Asa

## 2014-09-18 ENCOUNTER — Telehealth: Payer: Self-pay | Admitting: Family Medicine

## 2014-09-18 DIAGNOSIS — M25552 Pain in left hip: Secondary | ICD-10-CM

## 2014-09-18 NOTE — Telephone Encounter (Signed)
Pt had previously cancelled appt to ortho. Has not been discussed since last year but new referral was placed.

## 2014-09-18 NOTE — Telephone Encounter (Signed)
Caller name: Joeangel Relation to pt: self Call back number: [phone] Pharmacy:  Reason for call:   Patient thought that Dr. Birdie Riddle was going to refer him to an orthopaedic dr regarding hip pain. I do not see referral in chart??

## 2014-09-28 ENCOUNTER — Ambulatory Visit (AMBULATORY_SURGERY_CENTER): Payer: 59 | Admitting: Gastroenterology

## 2014-09-28 ENCOUNTER — Encounter: Payer: Self-pay | Admitting: Gastroenterology

## 2014-09-28 VITALS — BP 126/76 | HR 84 | Temp 98.8°F | Resp 24 | Ht 68.0 in | Wt 191.0 lb

## 2014-09-28 DIAGNOSIS — K222 Esophageal obstruction: Secondary | ICD-10-CM

## 2014-09-28 DIAGNOSIS — K219 Gastro-esophageal reflux disease without esophagitis: Secondary | ICD-10-CM

## 2014-09-28 DIAGNOSIS — K200 Eosinophilic esophagitis: Secondary | ICD-10-CM

## 2014-09-28 DIAGNOSIS — R1314 Dysphagia, pharyngoesophageal phase: Secondary | ICD-10-CM

## 2014-09-28 DIAGNOSIS — K2950 Unspecified chronic gastritis without bleeding: Secondary | ICD-10-CM

## 2014-09-28 MED ORDER — SODIUM CHLORIDE 0.9 % IV SOLN: 500.0000 mL | INTRAVENOUS | Status: DC

## 2014-09-28 NOTE — Progress Notes (Signed)
Stable to RR coughing - awake

## 2014-09-28 NOTE — Patient Instructions (Addendum)
  YOU HAD AN ENDOSCOPIC PROCEDURE TODAY AT [facility]: Refer to the procedure report that was given to you for any specific questions about what was found during the examination.  If the procedure report does not answer your questions, please call your gastroenterologist to clarify.  If you requested that your care partner not be given the details of your procedure findings, then the procedure report has been included in a sealed envelope for you to review at your convenience later.  YOU SHOULD EXPECT: Some feelings of bloating in the abdomen. Passage of more gas than usual.  Walking can help get rid of the air that was put into your GI tract during the procedure and reduce the bloating. If you had a lower endoscopy (such as a colonoscopy or flexible sigmoidoscopy) you may notice spotting of blood in your stool or on the toilet paper. If you underwent a bowel prep for your procedure, then you may not have a normal bowel movement for a few days.  DIET: follow dilatation diet given to you today  ACTIVITY: Your care partner should take you home directly after the procedure.  You should plan to take it easy, moving slowly for the rest of the day.  You can resume normal activity the day after the procedure however you should NOT DRIVE or use heavy machinery for 24 hours (because of the sedation medicines used during the test).    SYMPTOMS TO REPORT IMMEDIATELY: A gastroenterologist can be reached at any hour.  During normal business hours, 8:30 AM to 5:00 PM Monday through Friday, call [phone].  After hours and on weekends, please call the GI answering service at [phone] who will take a message and have the physician on call contact you.     Following upper endoscopy (EGD)  Vomiting of blood or coffee ground material  New chest pain or pain under the shoulder blades  Painful or persistently difficult swallowing  New shortness of breath  Fever of 100F or higher  Black,  tarry-looking stools  FOLLOW UP: If any biopsies were taken you will be contacted by phone or by letter within the next 1-3 weeks.  Call your gastroenterologist if you have not heard about the biopsies in 3 weeks.  Our staff will call the home number listed on your records the next business day following your procedure to check on you and address any questions or concerns that you may have at that time regarding the information given to you following your procedure. This is a courtesy call and so if there is no answer at the home number and we have not heard from you through the emergency physician on call, we will assume that you have returned to your regular daily activities without incident.  SIGNATURES/CONFIDENTIALITY: You and/or your care partner have signed paperwork which will be entered into your electronic medical record.  These signatures attest to the fact that that the information above on your After Visit Summary has been reviewed and is understood.  Full responsibility of the confidentiality of this discharge information lies with you and/or your care-partner.    INFORMATION ON REFLUX GIVEN TO YOU TODAY ( ANTI REFLUX REGIMEN)  CALL MAKE AN APPOINTMENT TO SEE DR Fuller Plan IN OFFICE IN ONE MONTH  CONTINUE REFLUX MEDICATION

## 2014-09-28 NOTE — Progress Notes (Signed)
Called to room to assist during endoscopic procedure.  Patient ID and intended procedure confirmed with present staff. Received instructions for my participation in the procedure from the performing physician.  

## 2014-09-28 NOTE — Op Note (Signed)
[place]  [address]. [place], [address]   ENDOSCOPY PROCEDURE REPORT  PATIENT: Alexander Hernandez, Alexander Hernandez  MR#: [id number] BIRTHDATE: [birth year] , 38  yrs. old GENDER: male ENDOSCOPIST: Ladene Artist, MD, [facility] REFERRED BY:  Midge Minium PROCEDURE DATE:  09/28/2014 PROCEDURE:  EGD w/ biopsy and EGD w/ wire guided (savary) dilation  ASA CLASS:     Class II INDICATIONS:  dysphagia and GERD. MEDICATIONS: Monitored anesthesia care, Propofol 250 mg IV, and lidocaine 40 mg IV TOPICAL ANESTHETIC: none DESCRIPTION OF PROCEDURE: After the risks benefits and alternatives of the procedure were thoroughly explained, informed consent was obtained.  The LB [id number] endoscope was introduced through the mouth and advanced to the second portion of the duodenum , Without limitations.  The instrument was slowly withdrawn as the mucosa was fully examined.  ESOPHAGUS: There was a short benign appearing stricture, with an inner diameter of 30mm, in the distal esophagus.  The stricture was easily traversable. The stricture was dilated using a 70mm (42Fr) savary dilator over guidewire, using a 93mm (45Fr) savary dilator over guidewire and using a 85mm (48Fr) savary dilator over guidewire. Minimal resistance and minimal heme with the last 2 dilators. Several linear furrows in the distal esophagus. Multiple biopsies were performed in the distal and mid esophagus. The esophagus was otherwise normal. STOMACH: The mucosa of the stomach  showed patchy erythema in the antrum and body. Biopsies were obtained. The stomach was otherwise normal. DUODENUM: The duodenal mucosa showed no abnormalities in the bulb and 2nd part of the duodenum.  Retroflexed views revealed a small hiatal hernia.  The scope was then withdrawn from the patient and the procedure completed. COMPLICATIONS: There were no immediate complications.  ENDOSCOPIC IMPRESSION: 1.   Distal esophageal stricture,  dilated savary dilators over guidewire. 2.    Linear furrows in the distal esophagus; multiple biopsies were performed 3.   Mild patchy gastric erythema; multiple biopsies performed 4.   Small hiatal hernia  RECOMMENDATIONS: 1.  Anti-reflux regimen long term 2.  Await pathology results 3.  Continue PPI 4.  Post dilation instructions 5.  Call office to schedule an office appointment for 1 month  eSigned:  Ladene Artist, MD, [facility] 09/28/2014 3:58 PM

## 2014-10-01 ENCOUNTER — Telehealth: Payer: Self-pay

## 2014-10-01 NOTE — Telephone Encounter (Signed)
  Follow up Call-  Call back number 09/28/2014  Post procedure Call Back phone  # [phone] [place], [place] NUMBER [phone]  Permission to leave phone message Yes     Patient questions:  Do you have a fever, pain , or abdominal swelling? No. Pain Score  0 *  Have you tolerated food without any problems? Yes.    Have you been able to return to your normal activities? Yes.    Do you have any questions about your discharge instructions: Diet   No. Medications  No. Follow up visit  No.  Do you have questions or concerns about your Care? No.  Actions: * If pain score is 4 or above: No action needed, pain <4.  Per the pt he had a headache for 2 days.  Headache gone this am, and feels fine. maw

## 2014-10-04 ENCOUNTER — Encounter: Payer: Self-pay | Admitting: Gastroenterology

## 2014-10-08 ENCOUNTER — Telehealth: Payer: Self-pay | Admitting: Internal Medicine

## 2014-10-08 NOTE — Telephone Encounter (Signed)
Gave verbal to [place] to separate medications. Nothing further was needed.

## 2014-11-09 ENCOUNTER — Ambulatory Visit: Payer: 59 | Admitting: Pulmonary Disease

## 2014-11-09 ENCOUNTER — Encounter: Payer: Self-pay | Admitting: Internal Medicine

## 2014-11-09 ENCOUNTER — Ambulatory Visit (INDEPENDENT_AMBULATORY_CARE_PROVIDER_SITE_OTHER): Payer: 59 | Admitting: Internal Medicine

## 2014-11-09 VITALS — BP 120/76 | HR 65 | Ht 69.0 in | Wt 191.8 lb

## 2014-11-09 DIAGNOSIS — J309 Allergic rhinitis, unspecified: Secondary | ICD-10-CM | POA: Diagnosis not present

## 2014-11-09 DIAGNOSIS — J3089 Other allergic rhinitis: Principal | ICD-10-CM

## 2014-11-09 DIAGNOSIS — J302 Other seasonal allergic rhinitis: Secondary | ICD-10-CM

## 2014-11-09 MED ORDER — AZELASTINE HCL 0.1 % NA SOLN: 2.0000 | Freq: Every day | NASAL | Status: DC

## 2014-11-09 MED ORDER — CETIRIZINE-PSEUDOEPHEDRINE ER 5-120 MG PO TB12: ORAL_TABLET | ORAL | Status: DC

## 2014-11-09 MED ORDER — FLUTICASONE PROPIONATE 50 MCG/ACT NA SUSP: 2.0000 | Freq: Every day | NASAL | Status: DC

## 2014-11-09 NOTE — Patient Instructions (Signed)
We can start allergy vaccine. I will have the allergy lab call you when your  Vaccine is ready  Scripts sent refilling nasal sprays and Zyrtec-D  Please call as needed

## 2014-11-09 NOTE — Progress Notes (Signed)
07/11/15- 39 yoM never smoker FOLLOWS FOR: Pt here for allergy consult. Pt  had allergy testing in 2003 and he was on injections GCDA. Complains of daily perennial sneezing and nasal congestion, watery nose. Using Nasacort irregularly. Occasional antihistamine and Sudafed. Denies history of asthma, food intolerance, urticaria, allergic problems from latex, contrast dye, aspirin or insect stings. 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 Pt states insurance will not cover dymista. Please write Rx for Zyrtec for patient to get through insurance. Allergy Profile 09/10/14-total IgE 282 with specific elevations for dust mite, cat, dog, mold, ragweed. One or 2 bad days recently. He has continued use of Sudafed and Zyrtec. We discussed environmental controls again. He wants to start allergy vaccine based on experience in 2003.  ROS-see HPI Constitutional:   No-   weight loss, night sweats, fevers, chills, fatigue, lassitude. HEENT:   No-  headaches, difficulty swallowing, tooth/dental problems, sore throat,       +: sneezing, itching, ear ache, +nasal congestion, post nasal drip,  CV:  No-   chest pain, orthopnea, PND, swelling in lower extremities, anasarca,                                  dizziness, palpitations Resp: No-   shortness of breath with exertion or at rest.              No-   productive cough,  No non-productive cough,  No- coughing up of blood.              No-   change in color of mucus.   wheezing.   Skin: No-   rash or lesions. GI:  +heartburn, indigestion, No-abdominal pain, nausea, vomiting, GU:. MS:  No-   joint pain or swelling.   Neuro-     nothing unusual Psych:  No- change in mood or affect. No  depression or anxiety.  No memory loss.  OBJ- Physical Exam General- Alert, Oriented, Affect-appropriate, Distress- none acute Skin- rash-none, lesions- none, excoriation- none Lymphadenopathy- none Head- atraumatic            Eyes- Gross vision intact, PERRLA, conjunctivae and secretions clear            Ears- Hearing, canals-normal            Nose- Clear, no-Septal dev, mucus, polyps, erosion, perforation             Throat- Mallampati II , mucosa clear , drainage- none, tonsils- atrophic Neck- flexible , trachea midline, no stridor , thyroid nl, carotid no bruit Chest - symmetrical excursion , unlabored           Heart/CV- RRR , no murmur , no gallop  , no rub, nl s1 s2                           - JVD- none , edema- none, stasis changes- none, varices- none           Lung- clear to P&A, wheeze- none, cough- none , dullness-none, rub- none           Chest wall-  Abd-  Br/  Gen/ Rectal- Not done, not indicated Extrem- cyanosis- none, clubbing, none, atrophy- none, strength- nl Neuro- grossly intact to observation

## 2014-11-29 NOTE — Assessment & Plan Note (Signed)
We have reviewed available medications. He very much wants to restart allergy vaccine. Plan-built allergy vaccine based on allergy profile. Short-term use of Zyrtec D

## 2014-12-13 ENCOUNTER — Telehealth: Payer: Self-pay | Admitting: Internal Medicine

## 2014-12-14 ENCOUNTER — Ambulatory Visit (INDEPENDENT_AMBULATORY_CARE_PROVIDER_SITE_OTHER): Payer: 59

## 2014-12-14 DIAGNOSIS — J309 Allergic rhinitis, unspecified: Secondary | ICD-10-CM | POA: Diagnosis not present

## 2015-01-10 ENCOUNTER — Ambulatory Visit: Payer: Self-pay | Admitting: Internal Medicine

## 2015-01-11 NOTE — Telephone Encounter (Signed)
I first called Alexander Hernandez 12/13/14 to let him know his vac. Was ready for him to start. He didn't show for his appt. Yesterday, so I called him back. He has been working til 10:00 at night and couldn't get here. He said he would be in next wk.[name]

## 2015-01-12 ENCOUNTER — Encounter (HOSPITAL_BASED_OUTPATIENT_CLINIC_OR_DEPARTMENT_OTHER): Payer: Self-pay | Admitting: *Deleted

## 2015-01-12 ENCOUNTER — Emergency Department (HOSPITAL_BASED_OUTPATIENT_CLINIC_OR_DEPARTMENT_OTHER)
Admission: EM | Admit: 2015-01-12 | Discharge: 2015-01-12 | Disposition: A | Discharge status: Home / Self Care | Payer: 59 | Attending: Emergency Medicine | Admitting: Emergency Medicine

## 2015-01-12 DIAGNOSIS — Y998 Other external cause status: Secondary | ICD-10-CM | POA: Diagnosis not present

## 2015-01-12 DIAGNOSIS — Y288XXA Contact with other sharp object, undetermined intent, initial encounter: Secondary | ICD-10-CM | POA: Insufficient documentation

## 2015-01-12 DIAGNOSIS — Z791 Long term (current) use of non-steroidal anti-inflammatories (NSAID): Secondary | ICD-10-CM | POA: Diagnosis not present

## 2015-01-12 DIAGNOSIS — Z79899 Other long term (current) drug therapy: Secondary | ICD-10-CM | POA: Insufficient documentation

## 2015-01-12 DIAGNOSIS — S51811A Laceration without foreign body of right forearm, initial encounter: Secondary | ICD-10-CM | POA: Insufficient documentation

## 2015-01-12 DIAGNOSIS — Y9289 Other specified places as the place of occurrence of the external cause: Secondary | ICD-10-CM | POA: Insufficient documentation

## 2015-01-12 DIAGNOSIS — Y9389 Activity, other specified: Secondary | ICD-10-CM | POA: Insufficient documentation

## 2015-01-12 MED ORDER — LIDOCAINE HCL (PF) 1 % IJ SOLN
5.0000 mL | Freq: Once | INTRAMUSCULAR | Status: AC
  Administered 2015-01-12: 5 mL via INTRADERMAL
  Filled 2015-01-12: qty 5

## 2015-01-12 NOTE — Discharge Instructions (Signed)
Local wound care with bacitracin and dressing changes twice daily.  Return to the emergency department if you develop redness, pus draining from the wound, or other new and concerning symptoms.   Laceration Care, Adult A laceration is a cut or lesion that goes through all layers of the skin and into the tissue just beneath the skin. TREATMENT  Some lacerations may not require closure. Some lacerations may not be able to be closed due to an increased risk of infection. It is important to see your caregiver as soon as possible after an injury to minimize the risk of infection and maximize the opportunity for successful closure. If closure is appropriate, pain medicines may be given, if needed. The wound will be cleaned to help prevent infection. Your caregiver will use stitches (sutures), staples, wound glue (adhesive), or skin adhesive strips to repair the laceration. These tools bring the skin edges together to allow for faster healing and a better cosmetic outcome. However, all wounds will heal with a scar. Once the wound has healed, scarring can be minimized by covering the wound with sunscreen during the day for 1 full year. HOME CARE INSTRUCTIONS  For sutures or staples:  Keep the wound clean and dry.  If you were given a bandage (dressing), you should change it at least once a day. Also, change the dressing if it becomes wet or dirty, or as directed by your caregiver.  Wash the wound with soap and water 2 times a day. Rinse the wound off with water to remove all soap. Pat the wound dry with a clean towel.  After cleaning, apply a thin layer of the antibiotic ointment as recommended by your caregiver. This will help prevent infection and keep the dressing from sticking.  You may shower as usual after the first 24 hours. Do not soak the wound in water until the sutures are removed.  Only take over-the-counter or prescription medicines for pain, discomfort, or fever as directed by your  caregiver.  Get your sutures or staples removed as directed by your caregiver. For skin adhesive strips:  Keep the wound clean and dry.  Do not get the skin adhesive strips wet. You may bathe carefully, using caution to keep the wound dry.  If the wound gets wet, pat it dry with a clean towel.  Skin adhesive strips will fall off on their own. You may trim the strips as the wound heals. Do not remove skin adhesive strips that are still stuck to the wound. They will fall off in time. For wound adhesive:  You may briefly wet your wound in the shower or bath. Do not soak or scrub the wound. Do not swim. Avoid periods of heavy perspiration until the skin adhesive has fallen off on its own. After showering or bathing, gently pat the wound dry with a clean towel.  Do not apply liquid medicine, cream medicine, or ointment medicine to your wound while the skin adhesive is in place. This may loosen the film before your wound is healed.  If a dressing is placed over the wound, be careful not to apply tape directly over the skin adhesive. This may cause the adhesive to be pulled off before the wound is healed.  Avoid prolonged exposure to sunlight or tanning lamps while the skin adhesive is in place. Exposure to ultraviolet light in the first year will darken the scar.  The skin adhesive will usually remain in place for 5 to 10 days, then naturally fall off the  skin. Do not pick at the adhesive film. You may need a tetanus shot if:  You cannot remember when you had your last tetanus shot.  You have never had a tetanus shot. If you get a tetanus shot, your arm may swell, get red, and feel warm to the touch. This is common and not a problem. If you need a tetanus shot and you choose not to have one, there is a rare chance of getting tetanus. Sickness from tetanus can be serious. SEEK MEDICAL CARE IF:   You have redness, swelling, or increasing pain in the wound.  You see a red line that goes away  from the wound.  You have yellowish-white fluid (pus) coming from the wound.  You have a fever.  You notice a bad smell coming from the wound or dressing.  Your wound breaks open before or after sutures have been removed.  You notice something coming out of the wound such as wood or glass.  Your wound is on your hand or foot and you cannot move a finger or toe. SEEK IMMEDIATE MEDICAL CARE IF:   Your pain is not controlled with prescribed medicine.  You have severe swelling around the wound causing pain and numbness or a change in color in your arm, hand, leg, or foot.  Your wound splits open and starts bleeding.  You have worsening numbness, weakness, or loss of function of any joint around or beyond the wound.  You develop painful lumps near the wound or on the skin anywhere on your body. MAKE SURE YOU:   Understand these instructions.  Will watch your condition.  Will get help right away if you are not doing well or get worse. Document Released: 07/27/2005 Document Revised: 10/19/2011 Document Reviewed: 01/20/2011 [facility] Patient Information 2015 [place], [place]. This information is not intended to replace advice given to you by your health care provider. Make sure you discuss any questions you have with your health care provider.

## 2015-01-12 NOTE — ED Notes (Signed)
Pt was throwing away metal (into dumpster) when a piece flew back and cut his right wrist.  About 2 inch laceration visible, no bleeding at this time
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
## 2015-01-29 ENCOUNTER — Ambulatory Visit
Admission: RE | Admit: 2015-01-29 | Discharge: 2015-01-29 | Disposition: A | Discharge status: Home / Self Care | Payer: Worker's Compensation | Source: Ambulatory Visit | Attending: Nurse Practitioner | Admitting: Nurse Practitioner

## 2015-01-29 ENCOUNTER — Other Ambulatory Visit: Payer: Self-pay | Admitting: Nurse Practitioner

## 2015-01-29 DIAGNOSIS — R609 Edema, unspecified: Secondary | ICD-10-CM

## 2015-01-29 DIAGNOSIS — M25561 Pain in right knee: Secondary | ICD-10-CM

## 2015-03-29 ENCOUNTER — Encounter: Payer: Self-pay | Admitting: Internal Medicine

## 2015-07-03 ENCOUNTER — Encounter (HOSPITAL_BASED_OUTPATIENT_CLINIC_OR_DEPARTMENT_OTHER): Payer: Self-pay | Admitting: *Deleted

## 2015-07-08 ENCOUNTER — Other Ambulatory Visit: Payer: Self-pay | Admitting: Physician Assistant

## 2015-07-08 NOTE — H&P (Signed)
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  A co-worker fell into the lateral aspect of his right knee causing an inward valgus stress. He was seen at that time and there was a concern about a Grade I versus Grade II MCL sprain. He was placed in a J&J brace along with continuing exercises and limiting the amount of motion of that joint.  He has continued to have medial joint line pain despite conservative management. This is localized in the medial posterior aspect of the right medial joint. He has eventual locking and pain with deep flexion but denies any acute effusion or giving away. He has never injured the right knee before and this is secondary to the work injury.  Past medical, family, social history and exam findings were noted and signed in the chart.  Review of systems: 12-point negative other than per HPI.    OBJECTIVE: Well-developed, well-nourished Caucasian male in no apparent distress. Alert and oriented times three.  Examination of the right knee reveals no effusion or soft tissue edema. He has medial joint line tenderness, especially in the posterior medial aspect. No lateral joint line tenderness. He has no patellofemoral tenderness. Negative patellofemoral compression. His range of motion is 0-130. He does have a positive Thessaly and a positive McMurray testing. The patella has normal tracking. Ligaments are intact with a normal valgus stress at 0 and 30 degrees. Normal varus stress at 0 and 30 degrees with negative ACL and PCL testing.   X-RAYS: None obtained today. Previous four view of the knee was unremarkable.   IMPRESSION: Follow-up visit for chronic, traumatic right knee meniscal tear secondary to a work related injury.  PLAN: At this point, with four months of symptoms with no improvement and continued mechanical symptoms concerning for a medial meniscus tear, we will obtain an MRI.   An ultrasound was placed on the medial joint line and there were visualized two small hypoechoic regions, one in the anterior horn of the meniscus and one in the posterior horn of the meniscus, where he is tender. This is concerning for a radial meniscal tear of the periphery.   We will follow-up after his MRI which we will need the [facility] Compensation physician, Dr. Geoffry Paradise to obtain.   If  she has any questions she can call us to discuss this case.   Ninetta Lights, M.D.  Addendum:  I saw Alexander Hernandez today after his MRI.  I am waiting for the official reading, but I have looked at it.  He has a medial meniscus tear which is going to require operative intervention.  Ligaments intact.  No other significant changes.  Very consistent with what I expected to see.  I went over his scan and findings with him.  Completed paperwork to proceed with arthroscopic assessment and debridement of his meniscus tear.  I would like to get to this as soon as possible and his symptoms certainly warrant that.  We are waiting for worker's comp. approval.    Ninetta Lights, M.D.

## 2015-07-11 ENCOUNTER — Encounter (HOSPITAL_BASED_OUTPATIENT_CLINIC_OR_DEPARTMENT_OTHER)
Admission: RE | Disposition: A | Discharge status: Home / Self Care | Payer: Self-pay | Source: Ambulatory Visit | Attending: Orthopedic Surgery

## 2015-07-11 ENCOUNTER — Encounter (HOSPITAL_BASED_OUTPATIENT_CLINIC_OR_DEPARTMENT_OTHER): Payer: Self-pay | Admitting: Anesthesiology

## 2015-07-11 ENCOUNTER — Ambulatory Visit (HOSPITAL_BASED_OUTPATIENT_CLINIC_OR_DEPARTMENT_OTHER)
Admission: RE | Admit: 2015-07-11 | Discharge: 2015-07-11 | Disposition: A | Discharge status: Home / Self Care | Payer: Worker's Compensation | Source: Ambulatory Visit | Attending: Orthopedic Surgery | Admitting: Orthopedic Surgery

## 2015-07-11 ENCOUNTER — Ambulatory Visit (HOSPITAL_BASED_OUTPATIENT_CLINIC_OR_DEPARTMENT_OTHER): Payer: Worker's Compensation | Admitting: Anesthesiology

## 2015-07-11 DIAGNOSIS — Z8249 Family history of ischemic heart disease and other diseases of the circulatory system: Secondary | ICD-10-CM | POA: Diagnosis not present

## 2015-07-11 DIAGNOSIS — M6751 Plica syndrome, right knee: Secondary | ICD-10-CM | POA: Diagnosis not present

## 2015-07-11 DIAGNOSIS — Z79899 Other long term (current) drug therapy: Secondary | ICD-10-CM | POA: Diagnosis not present

## 2015-07-11 DIAGNOSIS — R7303 Prediabetes: Secondary | ICD-10-CM | POA: Diagnosis not present

## 2015-07-11 DIAGNOSIS — Z8042 Family history of malignant neoplasm of prostate: Secondary | ICD-10-CM | POA: Diagnosis not present

## 2015-07-11 DIAGNOSIS — M23221 Derangement of posterior horn of medial meniscus due to old tear or injury, right knee: Secondary | ICD-10-CM | POA: Insufficient documentation

## 2015-07-11 DIAGNOSIS — N400 Enlarged prostate without lower urinary tract symptoms: Secondary | ICD-10-CM | POA: Insufficient documentation

## 2015-07-11 DIAGNOSIS — M25561 Pain in right knee: Secondary | ICD-10-CM | POA: Diagnosis present

## 2015-07-11 DIAGNOSIS — I10 Essential (primary) hypertension: Secondary | ICD-10-CM | POA: Insufficient documentation

## 2015-07-11 HISTORY — DX: Nausea with vomiting, unspecified: R11.2

## 2015-07-11 HISTORY — DX: Other specified postprocedural states: Z98.890

## 2015-07-11 HISTORY — PX: KNEE ARTHROSCOPY WITH MEDIAL MENISECTOMY: SHX5651

## 2015-07-11 HISTORY — PX: KNEE ARTHROSCOPY WITH LATERAL MENISECTOMY: SHX6193

## 2015-07-11 SURGERY — ARTHROSCOPY, KNEE, WITH MEDIAL MENISCECTOMY
Anesthesia: General | Site: Knee | Laterality: Right

## 2015-07-11 MED ORDER — GLYCOPYRROLATE 0.2 MG/ML IJ SOLN: 0.2000 mg | Freq: Once | INTRAMUSCULAR | Status: DC | PRN

## 2015-07-11 MED ORDER — METHOCARBAMOL 1000 MG/10ML IJ SOLN: 500.0000 mg | Freq: Four times a day (QID) | INTRAMUSCULAR | Status: DC | PRN

## 2015-07-11 MED ORDER — DEXAMETHASONE SODIUM PHOSPHATE 4 MG/ML IJ SOLN
INTRAMUSCULAR | Status: DC | PRN
  Administered 2015-07-11: 10 mg via INTRAVENOUS

## 2015-07-11 MED ORDER — FENTANYL CITRATE (PF) 100 MCG/2ML IJ SOLN
INTRAMUSCULAR | Status: AC
  Filled 2015-07-11: qty 2

## 2015-07-11 MED ORDER — BUPIVACAINE HCL (PF) 0.5 % IJ SOLN
INTRAMUSCULAR | Status: DC | PRN
  Administered 2015-07-11: 20 mL via INTRA_ARTICULAR

## 2015-07-11 MED ORDER — ONDANSETRON HCL 4 MG/2ML IJ SOLN: 4.0000 mg | Freq: Four times a day (QID) | INTRAMUSCULAR | Status: DC | PRN

## 2015-07-11 MED ORDER — ONDANSETRON HCL 4 MG PO TABS: 4.0000 mg | ORAL_TABLET | Freq: Three times a day (TID) | ORAL | Status: DC | PRN

## 2015-07-11 MED ORDER — METHYLPREDNISOLONE ACETATE 80 MG/ML IJ SUSP
INTRAMUSCULAR | Status: DC | PRN
  Administered 2015-07-11: 80 mg via INTRA_ARTICULAR

## 2015-07-11 MED ORDER — OXYCODONE-ACETAMINOPHEN 5-325 MG PO TABS: 1.0000 | ORAL_TABLET | ORAL | Status: DC | PRN

## 2015-07-11 MED ORDER — METOCLOPRAMIDE HCL 5 MG/ML IJ SOLN: 5.0000 mg | Freq: Three times a day (TID) | INTRAMUSCULAR | Status: DC | PRN

## 2015-07-11 MED ORDER — CHLORHEXIDINE GLUCONATE 4 % EX LIQD: 60.0000 mL | Freq: Once | CUTANEOUS | Status: DC

## 2015-07-11 MED ORDER — MIDAZOLAM HCL 2 MG/2ML IJ SOLN
INTRAMUSCULAR | Status: AC
  Filled 2015-07-11: qty 2

## 2015-07-11 MED ORDER — LACTATED RINGERS IV SOLN: INTRAVENOUS | Status: DC

## 2015-07-11 MED ORDER — HYDROMORPHONE HCL 1 MG/ML IJ SOLN: 0.2500 mg | INTRAMUSCULAR | Status: DC | PRN

## 2015-07-11 MED ORDER — LIDOCAINE HCL (CARDIAC) 20 MG/ML IV SOLN
INTRAVENOUS | Status: DC | PRN
  Administered 2015-07-11: 50 mg via INTRAVENOUS

## 2015-07-11 MED ORDER — PROPOFOL 500 MG/50ML IV EMUL
INTRAVENOUS | Status: AC
  Filled 2015-07-11: qty 50

## 2015-07-11 MED ORDER — CEFAZOLIN SODIUM-DEXTROSE 2-3 GM-% IV SOLR
2.0000 g | INTRAVENOUS | Status: AC
  Administered 2015-07-11: 2 g via INTRAVENOUS

## 2015-07-11 MED ORDER — LIDOCAINE HCL (CARDIAC) 20 MG/ML IV SOLN
INTRAVENOUS | Status: AC
  Filled 2015-07-11: qty 5

## 2015-07-11 MED ORDER — ONDANSETRON HCL 4 MG PO TABS: 4.0000 mg | ORAL_TABLET | Freq: Four times a day (QID) | ORAL | Status: DC | PRN

## 2015-07-11 MED ORDER — FENTANYL CITRATE (PF) 100 MCG/2ML IJ SOLN
50.0000 ug | INTRAMUSCULAR | Status: DC | PRN
  Administered 2015-07-11: 50 ug via INTRAVENOUS
  Administered 2015-07-11: 100 ug via INTRAVENOUS

## 2015-07-11 MED ORDER — LACTATED RINGERS IV SOLN
INTRAVENOUS | Status: DC
  Administered 2015-07-11: 10:00:00 via INTRAVENOUS

## 2015-07-11 MED ORDER — DEXAMETHASONE SODIUM PHOSPHATE 10 MG/ML IJ SOLN
INTRAMUSCULAR | Status: AC
  Filled 2015-07-11: qty 1

## 2015-07-11 MED ORDER — KETOROLAC TROMETHAMINE 30 MG/ML IJ SOLN
INTRAMUSCULAR | Status: AC
  Filled 2015-07-11: qty 1

## 2015-07-11 MED ORDER — KETOROLAC TROMETHAMINE 30 MG/ML IJ SOLN
INTRAMUSCULAR | Status: AC
Start: 2015-07-11 — End: 2015-07-11
  Filled 2015-07-11: qty 1

## 2015-07-11 MED ORDER — MIDAZOLAM HCL 2 MG/2ML IJ SOLN
1.0000 mg | INTRAMUSCULAR | Status: DC | PRN
  Administered 2015-07-11: 2 mg via INTRAVENOUS

## 2015-07-11 MED ORDER — ONDANSETRON HCL 4 MG/2ML IJ SOLN
INTRAMUSCULAR | Status: AC
  Filled 2015-07-11: qty 2

## 2015-07-11 MED ORDER — SODIUM CHLORIDE 0.9 % IR SOLN
Status: DC | PRN
  Administered 2015-07-11: 3000 mL

## 2015-07-11 MED ORDER — METHOCARBAMOL 500 MG PO TABS: 500.0000 mg | ORAL_TABLET | Freq: Four times a day (QID) | ORAL | Status: DC | PRN

## 2015-07-11 MED ORDER — METOCLOPRAMIDE HCL 5 MG PO TABS: 5.0000 mg | ORAL_TABLET | Freq: Three times a day (TID) | ORAL | Status: DC | PRN

## 2015-07-11 MED ORDER — HYDROMORPHONE HCL 1 MG/ML IJ SOLN: 0.5000 mg | INTRAMUSCULAR | Status: DC | PRN

## 2015-07-11 MED ORDER — IBUPROFEN 800 MG PO TABS: 800.0000 mg | ORAL_TABLET | Freq: Three times a day (TID) | ORAL | Status: DC | PRN

## 2015-07-11 MED ORDER — KETOROLAC TROMETHAMINE 30 MG/ML IJ SOLN
INTRAMUSCULAR | Status: DC | PRN
  Administered 2015-07-11: 30 mg via INTRAVENOUS

## 2015-07-11 MED ORDER — METHYLPREDNISOLONE ACETATE 80 MG/ML IJ SUSP
INTRAMUSCULAR | Status: AC
  Filled 2015-07-11: qty 2

## 2015-07-11 MED ORDER — PROMETHAZINE HCL 25 MG/ML IJ SOLN: 6.2500 mg | INTRAMUSCULAR | Status: DC | PRN

## 2015-07-11 MED ORDER — SCOPOLAMINE 1 MG/3DAYS TD PT72
1.0000 | MEDICATED_PATCH | Freq: Once | TRANSDERMAL | Status: DC | PRN
  Administered 2015-07-11: 1.5 mg via TRANSDERMAL

## 2015-07-11 MED ORDER — CEFAZOLIN SODIUM-DEXTROSE 2-3 GM-% IV SOLR
INTRAVENOUS | Status: AC
  Filled 2015-07-11: qty 50

## 2015-07-11 MED ORDER — ONDANSETRON HCL 4 MG/2ML IJ SOLN
INTRAMUSCULAR | Status: DC | PRN
  Administered 2015-07-11: 4 mg via INTRAVENOUS

## 2015-07-11 MED ORDER — SCOPOLAMINE 1 MG/3DAYS TD PT72
MEDICATED_PATCH | TRANSDERMAL | Status: AC
  Filled 2015-07-11: qty 1

## 2015-07-11 MED ORDER — KETOROLAC TROMETHAMINE 30 MG/ML IJ SOLN
30.0000 mg | Freq: Once | INTRAMUSCULAR | Status: AC
  Administered 2015-07-11: 30 mg via INTRAVENOUS

## 2015-07-11 MED ORDER — PROPOFOL 10 MG/ML IV BOLUS
INTRAVENOUS | Status: DC | PRN
  Administered 2015-07-11: 250 mg via INTRAVENOUS

## 2015-07-11 MED ORDER — OXYCODONE HCL 5 MG/5ML PO SOLN
5.0000 mg | Freq: Once | ORAL | Status: DC | PRN
Start: 2015-07-11 — End: 2015-07-11

## 2015-07-11 MED ORDER — BUPIVACAINE HCL (PF) 0.5 % IJ SOLN
INTRAMUSCULAR | Status: AC
  Filled 2015-07-11: qty 60

## 2015-07-11 MED ORDER — OXYCODONE HCL 5 MG PO TABS: 5.0000 mg | ORAL_TABLET | Freq: Once | ORAL | Status: DC | PRN

## 2015-07-11 SURGICAL SUPPLY — 40 items
BANDAGE ELASTIC 6 VELCRO ST LF (GAUZE/BANDAGES/DRESSINGS) ×3 IMPLANT
BLADE CUDA 5.5 (BLADE) IMPLANT
BLADE CUDA GRT WHITE 3.5 (BLADE) IMPLANT
BLADE CUTTER GATOR 3.5 (BLADE) ×3 IMPLANT
BLADE CUTTER MENIS 5.5 (BLADE) IMPLANT
BLADE GREAT WHITE 4.2 (BLADE) ×2 IMPLANT
BLADE GREAT WHITE 4.2MM (BLADE) ×1
BUR OVAL 4.0 (BURR) IMPLANT
CUTTER MENISCUS  4.2MM (BLADE)
CUTTER MENISCUS 4.2MM (BLADE) IMPLANT
DRAPE ARTHROSCOPY W/POUCH 90 (DRAPES) ×3 IMPLANT
DURAPREP 26ML APPLICATOR (WOUND CARE) ×3 IMPLANT
ELECT MENISCUS 165MM 90D (ELECTRODE) IMPLANT
ELECT REM PT RETURN 9FT ADLT (ELECTROSURGICAL)
ELECTRODE REM PT RTRN 9FT ADLT (ELECTROSURGICAL) IMPLANT
GAUZE SPONGE 4X4 12PLY STRL (GAUZE/BANDAGES/DRESSINGS) ×3 IMPLANT
GAUZE XEROFORM 1X8 LF (GAUZE/BANDAGES/DRESSINGS) ×3 IMPLANT
GLOVE BIOGEL PI IND STRL 7.0 (GLOVE) ×1 IMPLANT
GLOVE BIOGEL PI INDICATOR 7.0 (GLOVE) ×2
GLOVE ECLIPSE 6.5 STRL STRAW (GLOVE) ×2 IMPLANT
GLOVE ECLIPSE 7.0 STRL STRAW (GLOVE) ×3 IMPLANT
GLOVE SURG ORTHO 8.0 STRL STRW (GLOVE) ×3 IMPLANT
GOWN STRL REUS W/ TWL LRG LVL3 (GOWN DISPOSABLE) ×2 IMPLANT
GOWN STRL REUS W/ TWL XL LVL3 (GOWN DISPOSABLE) ×1 IMPLANT
GOWN STRL REUS W/TWL LRG LVL3 (GOWN DISPOSABLE) ×6
GOWN STRL REUS W/TWL XL LVL3 (GOWN DISPOSABLE) ×3
HOLDER KNEE FOAM BLUE (MISCELLANEOUS) ×3 IMPLANT
IV NS IRRIG 3000ML ARTHROMATIC (IV SOLUTION) ×6 IMPLANT
KNEE WRAP E Z 3 GEL PACK (MISCELLANEOUS) ×3 IMPLANT
MANIFOLD NEPTUNE II (INSTRUMENTS) ×3 IMPLANT
PACK ARTHROSCOPY DSU (CUSTOM PROCEDURE TRAY) ×3 IMPLANT
PACK BASIN DAY SURGERY FS (CUSTOM PROCEDURE TRAY) ×3 IMPLANT
PENCIL BUTTON HOLSTER BLD 10FT (ELECTRODE) IMPLANT
SET ARTHROSCOPY TUBING (MISCELLANEOUS) ×3
SET ARTHROSCOPY TUBING LN (MISCELLANEOUS) ×1 IMPLANT
SUT ETHILON 3 0 PS 1 (SUTURE) ×3 IMPLANT
SUT VIC AB 3-0 FS2 27 (SUTURE) IMPLANT
SYR 3ML 23GX1 SAFETY (SYRINGE) ×2 IMPLANT
TOWEL OR 17X24 6PK STRL BLUE (TOWEL DISPOSABLE) ×3 IMPLANT
WATER STERILE IRR 1000ML POUR (IV SOLUTION) ×3 IMPLANT

## 2015-07-11 NOTE — Anesthesia Preprocedure Evaluation (Addendum)
Anesthesia Evaluation  Patient identified by MRN, date of birth, ID band Patient awake    Reviewed: Allergy & Precautions, NPO status , Patient's Chart, lab work & pertinent test results  History of Anesthesia Complications (+) PONV  Airway Mallampati: II  TM Distance: >3 FB     Dental   Pulmonary neg pulmonary ROS,    breath sounds clear to auscultation       Cardiovascular negative cardio ROS   Rhythm:Regular Rate:Normal     Neuro/Psych negative neurological ROS     GI/Hepatic negative GI ROS, Neg liver ROS,   Endo/Other  negative endocrine ROS  Renal/GU negative Renal ROS     Musculoskeletal negative musculoskeletal ROS (+)   Abdominal   Peds  Hematology negative hematology ROS (+)   Anesthesia Other Findings   Reproductive/Obstetrics                            Anesthesia Physical Anesthesia Plan  ASA: I  Anesthesia Plan: General   Post-op Pain Management:    Induction: Intravenous  Airway Management Planned: LMA  Additional Equipment:   Intra-op Plan:   Post-operative Plan: Extubation in OR  Informed Consent: I have reviewed the patients History and Physical, chart, labs and discussed the procedure including the risks, benefits and alternatives for the proposed anesthesia with the patient or authorized representative who has indicated his/her understanding and acceptance.     Plan Discussed with: CRNA  Anesthesia Plan Comments:         Anesthesia Quick Evaluation

## 2015-07-11 NOTE — Anesthesia Procedure Notes (Signed)
Procedure Name: LMA Insertion Performed by: Dene Nazir W Pre-anesthesia Checklist: Patient identified, Emergency Drugs available, Suction available and Patient being monitored Patient Re-evaluated:Patient Re-evaluated prior to inductionOxygen Delivery Method: Circle System Utilized Preoxygenation: Pre-oxygenation with 100% oxygen Intubation Type: IV induction Ventilation: Mask ventilation without difficulty LMA: LMA inserted LMA Size: 4.0 Number of attempts: 1 Airway Equipment and Method: Bite block Placement Confirmation: positive ETCO2 Tube secured with: Tape Dental Injury: Teeth and Oropharynx as per pre-operative assessment      

## 2015-07-11 NOTE — Anesthesia Postprocedure Evaluation (Signed)
Anesthesia Post Note  Patient: Alexander Hernandez  Procedure(s) Performed: Procedure(s) (LRB): RIGHT KNEE ARTHROSCOPY WITH MEDIAL AND LATERAL MENISCECTOMIES (Right) KNEE ARTHROSCOPY WITH LATERAL MENISECTOMY (Right)  Patient location during evaluation: PACU Anesthesia Type: General Level of consciousness: awake and alert Pain management: satisfactory to patient Vital Signs Assessment: post-procedure vital signs reviewed and stable Respiratory status: spontaneous breathing Cardiovascular status: blood pressure returned to baseline Postop Assessment: no signs of nausea or vomiting Anesthetic complications: no    Last Vitals:  Filed Vitals:   07/11/15 1200 07/11/15 1228  BP: 112/79 131/88  Pulse: 82 78  Temp:  36.6 C  Resp: 15 16    Last Pain:  Filed Vitals:   07/11/15 1230  PainSc: 5         RLE Motor Response: Purposeful movement RLE Sensation: Full sensation      Tiajuana Amass

## 2015-07-11 NOTE — Discharge Instructions (Signed)

## 2015-07-11 NOTE — Transfer of Care (Signed)
Immediate Anesthesia Transfer of Care Note  Patient: Alexander Hernandez  Procedure(s) Performed: Procedure(s): RIGHT KNEE ARTHROSCOPY WITH MEDIAL AND LATERAL MENISCECTOMIES (Right) KNEE ARTHROSCOPY WITH LATERAL MENISECTOMY (Right)  Patient Location: PACU  Anesthesia Type:General  Level of Consciousness: awake and sedated  Airway & Oxygen Therapy: Patient Spontanous Breathing and Patient connected to face mask oxygen  Post-op Assessment: Report given to RN and Post -op Vital signs reviewed and stable  Post vital signs: Reviewed and stable  Last Vitals:  Filed Vitals:   07/11/15 0948  BP: 124/88  Pulse: 85  Temp: 36.8 C  Resp: 18    Complications: No apparent anesthesia complications

## 2015-07-11 NOTE — Interval H&P Note (Signed)
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 After consideration of risks, benefits and other options for treatment, the patient has consented to  Procedure(s): RIGHT KNEE ARTHROSCOPY CHONDROPLASTY WITH MEDIAL MENISCECTOMY (Right) as a surgical intervention .  The patient's history has been reviewed, patient examined, no change in status, stable for surgery.  I have reviewed the patient's chart and labs.  Questions were answered to the patient's satisfaction.     Ninetta Lights

## 2015-07-12 ENCOUNTER — Encounter (HOSPITAL_BASED_OUTPATIENT_CLINIC_OR_DEPARTMENT_OTHER): Payer: Self-pay | Admitting: Orthopedic Surgery

## 2015-07-12 NOTE — Op Note (Signed)
NAME:  TORANCE, PEREIDA NO.:  [id number]  MEDICAL RECORD NO.:  [id number]  LOCATION:                               FACILITY:  [facility]  PHYSICIAN:  Ninetta Lights, M.D. DATE OF BIRTH:  1975-09-10  DATE OF PROCEDURE:  07/11/2015 DATE OF DISCHARGE:  07/11/2015                              OPERATIVE REPORT   PREOPERATIVE DIAGNOSIS:  Right knee medial meniscus tear.  POSTOPERATIVE DIAGNOSES:  Right knee medial meniscus tear with a large displaced parrot-beak tear, posterior third of medial meniscus.  Small radial tears, anterior and middle third of lateral meniscus.  Fibrotic medial plica.  PROCEDURES:  Right knee exam under anesthesia, arthroscopy.  Debridement of lateral meniscus.  Partial medial meniscectomy.  Excised medial plica.  SURGEON:  Ninetta Lights, M.D.  ASSISTANT:  Elmyra Ricks, PA  ANESTHESIA:  General.  BLOOD LOSS:  Minimal.  SPECIMENS:  None.  CULTURES:  None.  COMPLICATIONS:  None.  DRESSINGS:  Soft compressive.  TOURNIQUET:  Not employed.  DESCRIPTION OF PROCEDURE:  The patient was brought to the operating room and placed on the operating table in supine position.  After adequate anesthesia had been obtained, leg holder applied.  Leg was prepped and draped in usual sterile fashion.  Two portals; one each medial and lateral parapatellar.  Arthroscope was introduced.  Knee was distended and inspected.  Patellofemoral joint and cartilage looked good.  Good tracking.  Large fibrotic medial plica excised.  ACL intact.  Lateral compartment looked good.  Small radial tears, anterior and middle third, saucerized out and tapered in smoothly.  Retaining fair amount of meniscus at completion.  Medially, large displaced parrot-beak tear of posterior third.  Saucerized out to a stable rim, tapered in smoothly. Had to remove a lot of the posterior third, salvaging all of the anterior two-thirds.  Entire knee examined.  No other findings  were appreciated.  Instruments and fluid were removed.  Portals were closed with nylon.  Knee was injected with Depo-Medrol and Marcaine.  Sterile compressive dressing applied.  Anesthesia was reversed.  Brought to the recovery room.  Tolerated the surgery well.  No complications.     Ninetta Lights, M.D.     DFM/MEDQ  D:  07/11/2015  T:  07/12/2015  Job:  [id number]

## 2015-07-16 ENCOUNTER — Telehealth: Payer: Self-pay | Admitting: Internal Medicine

## 2015-07-16 NOTE — Telephone Encounter (Signed)
Noted  

## 2015-07-16 NOTE — Telephone Encounter (Signed)
6 months. Consider him dc'd.

## 2015-07-16 NOTE — Telephone Encounter (Signed)
I made up vaccine 1:50,000, called pt. In June/2016, twice and He never came in . Please advise.

## 2015-07-17 ENCOUNTER — Emergency Department (HOSPITAL_COMMUNITY): Payer: Worker's Compensation

## 2015-07-17 ENCOUNTER — Emergency Department (HOSPITAL_COMMUNITY)
Admission: EM | Admit: 2015-07-17 | Discharge: 2015-07-17 | Disposition: A | Discharge status: Home / Self Care | Payer: Worker's Compensation | Attending: Emergency Medicine | Admitting: Emergency Medicine

## 2015-07-17 ENCOUNTER — Encounter (HOSPITAL_COMMUNITY): Payer: Self-pay | Admitting: Emergency Medicine

## 2015-07-17 DIAGNOSIS — R51 Headache: Secondary | ICD-10-CM | POA: Diagnosis present

## 2015-07-17 DIAGNOSIS — Z87438 Personal history of other diseases of male genital organs: Secondary | ICD-10-CM | POA: Insufficient documentation

## 2015-07-17 DIAGNOSIS — Z8709 Personal history of other diseases of the respiratory system: Secondary | ICD-10-CM | POA: Insufficient documentation

## 2015-07-17 DIAGNOSIS — R519 Headache, unspecified: Secondary | ICD-10-CM

## 2015-07-17 DIAGNOSIS — R110 Nausea: Secondary | ICD-10-CM | POA: Diagnosis not present

## 2015-07-17 DIAGNOSIS — R0602 Shortness of breath: Secondary | ICD-10-CM | POA: Insufficient documentation

## 2015-07-17 DIAGNOSIS — Z87828 Personal history of other (healed) physical injury and trauma: Secondary | ICD-10-CM | POA: Insufficient documentation

## 2015-07-17 LAB — CBC
HCT: 48.2 % (ref 39.0–52.0)
Hemoglobin: 16.4 g/dL (ref 13.0–17.0)
MCH: 29.8 pg (ref 26.0–34.0)
MCHC: 34 g/dL (ref 30.0–36.0)
MCV: 87.5 fL (ref 78.0–100.0)
PLATELETS: 272 10*3/uL (ref 150–400)
RBC: 5.51 MIL/uL (ref 4.22–5.81)
RDW: 12.6 % (ref 11.5–15.5)
WBC: 11.7 10*3/uL — AB (ref 4.0–10.5)

## 2015-07-17 LAB — I-STAT TROPONIN, ED: TROPONIN I, POC: 0 ng/mL (ref 0.00–0.08)

## 2015-07-17 LAB — BASIC METABOLIC PANEL
Anion gap: 8 (ref 5–15)
BUN: 10 mg/dL (ref 6–20)
CALCIUM: 9.8 mg/dL (ref 8.9–10.3)
CHLORIDE: 104 mmol/L (ref 101–111)
CO2: 31 mmol/L (ref 22–32)
CREATININE: 1.12 mg/dL (ref 0.61–1.24)
GFR calc non Af Amer: >60 mL/min (ref >60)
Glucose, Bld: 88 mg/dL (ref 65–99)
Potassium: 4 mmol/L (ref 3.5–5.1)
SODIUM: 143 mmol/L (ref 135–145)

## 2015-07-17 LAB — D-DIMER, QUANTITATIVE: D-Dimer, Quant: 0.48 ug/mL-FEU (ref 0.00–0.50)

## 2015-07-17 MED ORDER — SODIUM CHLORIDE 0.9 % IV SOLN: 1000.0000 mL | INTRAVENOUS | Status: DC

## 2015-07-17 MED ORDER — SODIUM CHLORIDE 0.9 % IV SOLN
1000.0000 mL | Freq: Once | INTRAVENOUS | Status: AC
  Administered 2015-07-17: 1000 mL via INTRAVENOUS

## 2015-07-17 MED ORDER — METOCLOPRAMIDE HCL 5 MG/ML IJ SOLN
10.0000 mg | Freq: Once | INTRAMUSCULAR | Status: AC
Start: 2015-07-17 — End: 2015-07-17
  Administered 2015-07-17: 10 mg via INTRAVENOUS
  Filled 2015-07-17: qty 2

## 2015-07-17 MED ORDER — DIPHENHYDRAMINE HCL 50 MG/ML IJ SOLN
25.0000 mg | Freq: Once | INTRAMUSCULAR | Status: AC
  Administered 2015-07-17: 25 mg via INTRAVENOUS
  Filled 2015-07-17: qty 1

## 2015-07-17 NOTE — ED Notes (Signed)
Pt A&OX4, ambulatory at d/c with cane with steady gait, NAD

## 2015-07-17 NOTE — Discharge Instructions (Signed)

## 2015-07-17 NOTE — ED Notes (Signed)
Pt sts increased SOB and HA starting last night; pt sts worse with exertion and light sensitive; pt sts recent knee sx less than a week ago
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
## 2015-07-29 ENCOUNTER — Encounter: Payer: Self-pay | Admitting: Internal Medicine

## 2015-08-02 ENCOUNTER — Telehealth: Payer: Self-pay | Admitting: *Deleted

## 2015-08-02 NOTE — Telephone Encounter (Signed)
Received fax from [place], [facility] informing that patient has Surgery scheduled on August 22, 2015 for Total Hip Arthroplasty and provider office is requesting Medical/Surgical Clearance appointment prior; forwarded to scheduler/SLS 12/23  Please call patient and schedule 30-Minute Appointment for Medical/Surgical Clearance prior to 08/22/15 Sx date with PCP/SLS Thanks.

## 2015-08-07 ENCOUNTER — Ambulatory Visit (INDEPENDENT_AMBULATORY_CARE_PROVIDER_SITE_OTHER): Payer: 59

## 2015-08-07 ENCOUNTER — Ambulatory Visit: Payer: 59 | Admitting: Family Medicine

## 2015-08-07 ENCOUNTER — Ambulatory Visit (INDEPENDENT_AMBULATORY_CARE_PROVIDER_SITE_OTHER): Payer: 59 | Admitting: Family Medicine

## 2015-08-07 ENCOUNTER — Encounter: Payer: Self-pay | Admitting: Family Medicine

## 2015-08-07 VITALS — BP 134/82 | HR 79 | Temp 98.6°F | Resp 16 | Ht 69.0 in | Wt 191.1 lb

## 2015-08-07 DIAGNOSIS — Z01818 Encounter for other preprocedural examination: Secondary | ICD-10-CM

## 2015-08-07 DIAGNOSIS — M1612 Unilateral primary osteoarthritis, left hip: Secondary | ICD-10-CM

## 2015-08-07 DIAGNOSIS — Z13820 Encounter for screening for osteoporosis: Secondary | ICD-10-CM | POA: Diagnosis not present

## 2015-08-07 LAB — CBC WITH DIFFERENTIAL/PLATELET
BASOS PCT: 0.4 % (ref 0.0–3.0)
Basophils Absolute: 0 10*3/uL (ref 0.0–0.1)
EOS PCT: 1.2 % (ref 0.0–5.0)
Eosinophils Absolute: 0.1 10*3/uL (ref 0.0–0.7)
HCT: 46.6 % (ref 39.0–52.0)
HEMOGLOBIN: 15.4 g/dL (ref 13.0–17.0)
Lymphocytes Relative: 13.1 % (ref 12.0–46.0)
Lymphs Abs: 1.5 10*3/uL (ref 0.7–4.0)
MCHC: 33.2 g/dL (ref 30.0–36.0)
MCV: 88.3 fl (ref 78.0–100.0)
MONO ABS: 1 10*3/uL (ref 0.1–1.0)
Monocytes Relative: 8.5 % (ref 3.0–12.0)
NEUTROS ABS: 8.7 10*3/uL — AB (ref 1.4–7.7)
Neutrophils Relative %: 76.8 % (ref 43.0–77.0)
Platelets: 257 10*3/uL (ref 150.0–400.0)
RBC: 5.28 Mil/uL (ref 4.22–5.81)
RDW: 13.2 % (ref 11.5–15.5)
WBC: 11.3 10*3/uL — AB (ref 4.0–10.5)

## 2015-08-07 LAB — COMPREHENSIVE METABOLIC PANEL
ALBUMIN: 4.3 g/dL (ref 3.5–5.2)
ALT: 29 U/L (ref 0–53)
AST: 20 U/L (ref 0–37)
Alkaline Phosphatase: 47 U/L (ref 39–117)
BILIRUBIN TOTAL: 0.6 mg/dL (ref 0.2–1.2)
BUN: 14 mg/dL (ref 6–23)
CALCIUM: 9.9 mg/dL (ref 8.4–10.5)
CHLORIDE: 104 meq/L (ref 96–112)
CO2: 29 mEq/L (ref 19–32)
CREATININE: 1.04 mg/dL (ref 0.40–1.50)
GFR: 83.34 mL/min (ref >60.00)
Glucose, Bld: 84 mg/dL (ref 70–99)
Potassium: 4.3 mEq/L (ref 3.5–5.1)
Sodium: 141 mEq/L (ref 135–145)
Total Protein: 7.1 g/dL (ref 6.0–8.3)

## 2015-08-07 LAB — VITAMIN D 25 HYDROXY (VIT D DEFICIENCY, FRACTURES): VITD: 26.37 ng/mL — AB (ref 30.00–100.00)

## 2015-08-07 LAB — POCT URINALYSIS DIPSTICK
Bilirubin, UA: NEGATIVE
Glucose, UA: NEGATIVE
Ketones, UA: NEGATIVE
Leukocytes, UA: NEGATIVE
Nitrite, UA: NEGATIVE
PH UA: 6
PROTEIN UA: NEGATIVE
RBC UA: NEGATIVE
UROBILINOGEN UA: 0.2

## 2015-08-07 LAB — FERRITIN: Ferritin: 106.2 ng/mL (ref 22.0–322.0)

## 2015-08-07 LAB — PROTIME-INR
INR: 0.9 ratio (ref 0.8–1.0)
Prothrombin Time: 9.7 s (ref 9.6–13.1)

## 2015-08-07 LAB — APTT: aPTT: 25.8 s (ref 23.4–32.7)

## 2015-08-07 MED ORDER — CETIRIZINE-PSEUDOEPHEDRINE ER 5-120 MG PO TB12: ORAL_TABLET | ORAL | Status: DC

## 2015-08-07 MED ORDER — MONTELUKAST SODIUM 10 MG PO TABS: 10.0000 mg | ORAL_TABLET | Freq: Every day | ORAL | Status: DC

## 2015-08-07 MED ORDER — OMEPRAZOLE 40 MG PO CPDR: 40.0000 mg | DELAYED_RELEASE_CAPSULE | Freq: Every day | ORAL | Status: DC

## 2015-08-07 NOTE — Progress Notes (Signed)
Subjective:    Alexander Hernandez is a 39 y.o. male who presents to the office today for a preoperative consultation at the request of surgeon Dr Nicholos Johns who plans on performing L hip resurfacing on TBD  . This consultation is requested for the specific conditions prompting preoperative evaluation (i.e. because of potential affect on operative risk): none. Planned anesthesia: general. The patient has the following known anesthesia issues: none. Patients bleeding risk: no recent abnormal bleeding and no remote history of abnormal bleeding. Patient does not have objections to receiving blood products if needed.  The following portions of the patient's history were reviewed and updated as appropriate: allergies, current medications, past family history, past medical history, past social history, past surgical history and problem list.  Review of Systems no CP, SOB, HAs, visual changes, abd pain, N/V/D, weakness/numbness above baseline    Objective:      General Appearance:    Alert, cooperative, no distress, appears stated age  Head:    Normocephalic, without obvious abnormality, atraumatic  Eyes:    PERRL, conjunctiva/corneas clear, EOM's intact, fundi    benign, both eyes       Ears:    Normal TM's and external ear canals, both ears  Nose:   Nares normal, septum midline, mucosa normal, no drainage   or sinus tenderness  Throat:   Lips, mucosa, and tongue normal; teeth and gums normal  Neck:   Supple, symmetrical, trachea midline, no adenopathy;       thyroid:  No enlargement/tenderness/nodules  Back:     Symmetric, no curvature, ROM normal, no CVA tenderness  Lungs:     Clear to auscultation bilaterally, respirations unlabored  Chest wall:    No tenderness or deformity  Heart:    Regular rate and rhythm, S1 and S2 normal, no murmur, rub   or gallop  Abdomen:     Soft, non-tender, bowel sounds active all four quadrants,    no masses, no organomegaly  Genitalia:    Normal male without  lesion, masses,discharge or tenderness  Rectal:    Deferred due to young age  Extremities:   Extremities normal, atraumatic, no cyanosis or edema  Pulses:   2+ and symmetric all extremities  Skin:   Skin color, texture, turgor normal, no rashes or lesions  Lymph nodes:   Cervical, supraclavicular, and axillary nodes normal  Neurologic:   CNII-XII intact. Normal strength, sensation and reflexes      throughout     Predictors of intubation difficulty:  Morbid obesity? no  Anatomically abnormal facies? no  Prominent incisors? no  Receding mandible? no  Short, thick neck? no  Neck range of motion: normal  Dentition: No chipped, loose, or missing teeth.  Cardiographics ECG: normal sinus rhythm, no blocks or conduction defects, no ischemic changes Echocardiogram: not done  Imaging Chest x-ray: not done   Lab Review  TO BE DONE TODAY      Assessment:      39 y.o. male with planned surgery as above.   Known risk factors for perioperative complications: None   Difficulty with intubation is not anticipated.  Cardiac Risk Estimation: low  Current medications which may produce withdrawal symptoms if withheld perioperatively: none      Plan:    1. Preoperative workup as follows ECG, labs. 2. Change in medication regimen before surgery: discontinue NSAIDs (Mobic) 14 days before surgery. 3. Prophylaxis for cardiac events with perioperative beta-blockers: not indicated. 4. Invasive hemodynamic monitoring perioperatively: at the discretion  of anesthesiologist. 5. Deep vein thrombosis prophylaxis postoperatively:regimen to be chosen by surgical team. 6. Surveillance for postoperative MI with ECG immediately postoperatively and on postoperative days 1 and 2 AND troponin levels 24 hours postoperatively and on day 4 or hospital discharge (whichever comes first): at the discretion of anesthesiologist. 7. Other measures: None

## 2015-08-07 NOTE — Progress Notes (Signed)
Pre visit review using our clinic review tool, if applicable. No additional management support is needed unless otherwise documented below in the visit note. 

## 2015-08-07 NOTE — Patient Instructions (Signed)
Follow up in 1 year for a complete physical (or as needed) We'll notify you of your lab results and make any changes if needed Stop downstairs and get/schedule your bone density Call with any questions or concerns If you want to join [place] at the new [place] office, any scheduled appointments will automatically transfer and we will see you at [address] [place] [address] [place], [place], [place] [address] ([place]) GOOD LUCK WITH SURGERY!!!

## 2015-08-08 ENCOUNTER — Encounter: Payer: Self-pay | Admitting: General Practice

## 2015-08-08 ENCOUNTER — Inpatient Hospital Stay (HOSPITAL_COMMUNITY): Admission: RE | Admit: 2015-08-08 | Payer: Self-pay | Source: Ambulatory Visit

## 2015-08-14 NOTE — Telephone Encounter (Signed)
Pt had surgical clearance appt with Dr. Birdie Riddle on 08/07/2015. JG//CMA

## 2015-08-14 NOTE — Telephone Encounter (Signed)
Received fax from [facility] again requesting Surgical Clearance from 08/07/15 OV, asking for office note, labs, EKG scan and DEXA scan; all paperwork faxed to [phone], called and verified they received all paperwork and had necessary clearance written in notes/SLS

## 2015-08-21 ENCOUNTER — Inpatient Hospital Stay (HOSPITAL_COMMUNITY): Admission: RE | Admit: 2015-08-21 | Payer: 59 | Source: Ambulatory Visit | Admitting: Orthopedic Surgery

## 2015-08-21 ENCOUNTER — Encounter (HOSPITAL_COMMUNITY): Admission: RE | Payer: Self-pay | Source: Ambulatory Visit

## 2015-08-21 SURGERY — ARTHROPLASTY, HIP, TOTAL, ANTERIOR APPROACH
Anesthesia: General | Laterality: Left

## 2015-08-27 ENCOUNTER — Telehealth: Payer: Self-pay | Admitting: Family Medicine

## 2015-08-27 NOTE — Telephone Encounter (Signed)
Caller name: Self  Can be reached: [phone]   Reason for call: Request referral for PT following his hip surgery. States that Surgeon in [place], [place] informed him that he needed to have his PCP get him a PT referral in [place]. He also need an X-Ray within 6 week of 08/22/2015. Plse adv

## 2015-08-28 ENCOUNTER — Other Ambulatory Visit: Payer: Self-pay | Admitting: Family Medicine

## 2015-08-28 DIAGNOSIS — Z9889 Other specified postprocedural states: Secondary | ICD-10-CM

## 2015-08-28 NOTE — Telephone Encounter (Signed)
Pt will need to get order for xray from his surgeon and he can then go to any of the local imaging facilities (including downstairs) with that order/prescription.  He should also get an specific order for PT from his surgeon so that the therapists are aware what they should be doing.  We can facilitate getting into a good therapy group- but the order should come from the surgeon.

## 2015-08-28 NOTE — Telephone Encounter (Signed)
Per Dr. Birdie Riddle ok for XR and referral. Patient notified. Advised to take all paperwork to XR and PT. Patient agreed

## 2015-08-28 NOTE — Telephone Encounter (Signed)
Order placed

## 2015-08-28 NOTE — Telephone Encounter (Signed)
Will need referral placed for PT

## 2015-10-07 ENCOUNTER — Encounter: Payer: Self-pay | Admitting: Physical Therapy

## 2015-10-07 ENCOUNTER — Other Ambulatory Visit (HOSPITAL_BASED_OUTPATIENT_CLINIC_OR_DEPARTMENT_OTHER): Payer: Self-pay | Admitting: *Deleted

## 2015-10-07 ENCOUNTER — Ambulatory Visit (HOSPITAL_BASED_OUTPATIENT_CLINIC_OR_DEPARTMENT_OTHER)
Admission: RE | Admit: 2015-10-07 | Discharge: 2015-10-07 | Disposition: A | Discharge status: Home / Self Care | Payer: 59 | Source: Ambulatory Visit | Attending: Family Medicine | Admitting: Family Medicine

## 2015-10-07 ENCOUNTER — Ambulatory Visit: Payer: 59 | Attending: Family Medicine | Admitting: Physical Therapy

## 2015-10-07 DIAGNOSIS — R262 Difficulty in walking, not elsewhere classified: Secondary | ICD-10-CM | POA: Diagnosis present

## 2015-10-07 DIAGNOSIS — M1612 Unilateral primary osteoarthritis, left hip: Secondary | ICD-10-CM

## 2015-10-07 DIAGNOSIS — M25552 Pain in left hip: Secondary | ICD-10-CM | POA: Diagnosis present

## 2015-10-07 DIAGNOSIS — M25652 Stiffness of left hip, not elsewhere classified: Secondary | ICD-10-CM

## 2015-10-07 DIAGNOSIS — R29898 Other symptoms and signs involving the musculoskeletal system: Secondary | ICD-10-CM | POA: Diagnosis present

## 2015-10-07 DIAGNOSIS — Z9889 Other specified postprocedural states: Secondary | ICD-10-CM | POA: Diagnosis not present
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
## 2015-10-09 ENCOUNTER — Ambulatory Visit: Payer: 59 | Attending: Family Medicine

## 2015-10-09 DIAGNOSIS — M25552 Pain in left hip: Secondary | ICD-10-CM | POA: Diagnosis present

## 2015-10-09 DIAGNOSIS — R29898 Other symptoms and signs involving the musculoskeletal system: Secondary | ICD-10-CM | POA: Insufficient documentation

## 2015-10-09 DIAGNOSIS — R262 Difficulty in walking, not elsewhere classified: Secondary | ICD-10-CM | POA: Diagnosis present

## 2015-10-09 DIAGNOSIS — M25652 Stiffness of left hip, not elsewhere classified: Secondary | ICD-10-CM | POA: Diagnosis present
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
## 2015-10-16 ENCOUNTER — Ambulatory Visit: Payer: 59

## 2015-10-16 DIAGNOSIS — M25552 Pain in left hip: Secondary | ICD-10-CM

## 2015-10-16 DIAGNOSIS — M25652 Stiffness of left hip, not elsewhere classified: Secondary | ICD-10-CM

## 2015-10-16 DIAGNOSIS — R262 Difficulty in walking, not elsewhere classified: Secondary | ICD-10-CM

## 2015-10-16 DIAGNOSIS — R29898 Other symptoms and signs involving the musculoskeletal system: Secondary | ICD-10-CM
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
## 2015-10-18 ENCOUNTER — Ambulatory Visit: Payer: 59

## 2015-10-30 ENCOUNTER — Telehealth: Payer: Self-pay | Admitting: Family Medicine

## 2015-10-30 NOTE — Telephone Encounter (Signed)
Caller name:Self  Can be reached: [phone]  Pharmacy:  [facility] [address] [place] [phone] (Phone) [phone] (Fax)        Reason for call: Refill on omeprazole (PRILOSEC) 40 MG capsule [id number]

## 2015-10-31 ENCOUNTER — Ambulatory Visit: Payer: 59 | Admitting: Physical Therapy

## 2015-10-31 ENCOUNTER — Encounter: Payer: Self-pay | Admitting: Physical Therapy

## 2015-10-31 DIAGNOSIS — R262 Difficulty in walking, not elsewhere classified: Secondary | ICD-10-CM

## 2015-10-31 DIAGNOSIS — R29898 Other symptoms and signs involving the musculoskeletal system: Secondary | ICD-10-CM

## 2015-10-31 DIAGNOSIS — M25552 Pain in left hip: Secondary | ICD-10-CM | POA: Diagnosis not present

## 2015-10-31 MED ORDER — OMEPRAZOLE 40 MG PO CPDR: 40.0000 mg | DELAYED_RELEASE_CAPSULE | Freq: Every day | ORAL | Status: DC

## 2015-10-31 NOTE — Telephone Encounter (Signed)
Medication filled to pharmacy as requested.   
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
## 2015-11-04 ENCOUNTER — Other Ambulatory Visit: Payer: Self-pay | Admitting: Physician Assistant

## 2015-11-04 NOTE — H&P (Signed)
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 Alexander Hernandez is status post right knee arthroscopic debridement, medial meniscus and lateral meniscus. Date of surgery was 07-11-15. He really had no arthritic change found during surgery. Alexander Hernandez was doing fairly well for the first month or so following surgery and then noted the return of pain. This feels just like it did prior to surgical intervention. All of his pain is in the medial aspect. He described this as a sharp shooting pain with pivoting. No locking or catching.  No known injury or change in activity. He does tell me he had his left hip resurfaced on 08-22-15. He does not feel as though he is limping or favoring his other leg from this however.  Past Medical, Family and Social History reviewed in detail on patient questionnaire and signed.   Review of systems as detailed on HPI; all others reviewed and are negative.    EXAMINATION: Well-developed, well-nourished male in no acute distress.  Alert and oriented x 3.  Lungs clear to auscultation bilaterally.  Heart sounds normal.  Examination of right knee reveals a well healed surgical portal without evidence of infection. The range of motion is from 0-125 degrees. No effusion. There is marked tenderness over the posterior horn of the medial meniscus. Neurovascularly intact distally.   X-RAYS:  Three views of his right knee reveal minimal decreased joint space as compared to X-rays from June 2016.   PLAN: At this point I really feel Alexander Hernandez has likely favored his right knee secondary to his left hip surgery and he is really inflamed things. I offered him a cortisone injection today but he politely declined. He would like to go ahead and get to the bottom of this and would like to proceed with an MRI to make sure he has not retorn his meniscus. For that reason we will proceed with an MRI of his right knee. He is to follow-up once this is  complete.    Ninetta Lights, M.D.  Addendum: Alexander Hernandez is seen in follow up for his right knee.  He is status post arthroscopy and partial medial meniscectomy for a very obvious large displaced radial tear.  Improved, but he has had continued increasing symptoms medially.  As a result we repeated his MRI scan.  I have looked at the old scan, as well as the new scan.  The area of meniscectomy previously treated has definitely been removed.  Unfortunately on the pre-op film there is a small radial tear within the substance of the posterior horn that arthroscopically did not exit out through the meniscus and therefore wasn't treated with further meniscectomy.  Because of his young age I was trying to preserve as much as I could.  Unfortunately that tear has now extended and is a free edge tear of the posterior horn.  This is exactly where he has his symptoms.    DISPOSITION:  More than 25 minutes spent face-to-face discussing the situation with Alexander Hernandez.  He is still in the midst of recovering from a resurfacing arthroplasty of his hip.  He is still not back to complete full ambulation.  Based on the continued mechanical symptoms and progression of further meniscal tearing, we are going to need to repeat his arthroscopy and debride more meniscus.  I would like to see him get further along from recovery from his hip.  He is out of work for at least 12 weeks  after his hip surgery, which is another month from now.  At that time we will proceed with his knee arthroscopy, further debridement of more meniscus.  Anticipate out of work 6-12 weeks after that.  The issues with his knee relate to a worker's comp. injury, his hip does not.  All of this discussed with him.  He understands.  Paperwork complete.  We are going to get to this as discussed above.     Ninetta Lights, M.D.

## 2015-11-05 ENCOUNTER — Ambulatory Visit: Payer: 59 | Admitting: Physical Therapy

## 2015-11-05 ENCOUNTER — Encounter: Payer: Self-pay | Admitting: Physical Therapy

## 2015-11-05 DIAGNOSIS — R262 Difficulty in walking, not elsewhere classified: Secondary | ICD-10-CM

## 2015-11-05 DIAGNOSIS — M25552 Pain in left hip: Secondary | ICD-10-CM | POA: Diagnosis not present

## 2015-11-05 DIAGNOSIS — M25652 Stiffness of left hip, not elsewhere classified: Secondary | ICD-10-CM

## 2015-11-05 DIAGNOSIS — R29898 Other symptoms and signs involving the musculoskeletal system: Secondary | ICD-10-CM
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
## 2015-11-06 ENCOUNTER — Encounter: Payer: Self-pay | Admitting: Physical Therapy

## 2015-11-06 ENCOUNTER — Ambulatory Visit: Payer: 59 | Admitting: Physical Therapy

## 2015-11-06 DIAGNOSIS — M25652 Stiffness of left hip, not elsewhere classified: Secondary | ICD-10-CM

## 2015-11-06 DIAGNOSIS — M25552 Pain in left hip: Secondary | ICD-10-CM

## 2015-11-06 DIAGNOSIS — R262 Difficulty in walking, not elsewhere classified: Secondary | ICD-10-CM

## 2015-11-06 DIAGNOSIS — R29898 Other symptoms and signs involving the musculoskeletal system: Secondary | ICD-10-CM
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
## 2015-11-07 ENCOUNTER — Encounter (HOSPITAL_BASED_OUTPATIENT_CLINIC_OR_DEPARTMENT_OTHER): Payer: Self-pay | Admitting: *Deleted

## 2015-11-08 ENCOUNTER — Other Ambulatory Visit: Payer: Self-pay

## 2015-11-08 ENCOUNTER — Encounter (HOSPITAL_BASED_OUTPATIENT_CLINIC_OR_DEPARTMENT_OTHER)
Admission: RE | Admit: 2015-11-08 | Discharge: 2015-11-08 | Disposition: A | Discharge status: Home / Self Care | Payer: 59 | Source: Ambulatory Visit | Attending: Orthopedic Surgery | Admitting: Orthopedic Surgery

## 2015-11-08 DIAGNOSIS — Z01810 Encounter for preprocedural cardiovascular examination: Secondary | ICD-10-CM | POA: Insufficient documentation

## 2015-11-08 DIAGNOSIS — X58XXXA Exposure to other specified factors, initial encounter: Secondary | ICD-10-CM | POA: Insufficient documentation

## 2015-11-08 DIAGNOSIS — S83206A Unspecified tear of unspecified meniscus, current injury, right knee, initial encounter: Secondary | ICD-10-CM | POA: Insufficient documentation

## 2015-11-11 ENCOUNTER — Ambulatory Visit: Payer: 59 | Admitting: Physical Therapy

## 2015-11-13 ENCOUNTER — Ambulatory Visit: Payer: 59 | Attending: Family Medicine | Admitting: Physical Therapy

## 2015-11-13 ENCOUNTER — Encounter: Payer: Self-pay | Admitting: Physical Therapy

## 2015-11-13 DIAGNOSIS — M25652 Stiffness of left hip, not elsewhere classified: Secondary | ICD-10-CM

## 2015-11-13 DIAGNOSIS — M6281 Muscle weakness (generalized): Secondary | ICD-10-CM | POA: Insufficient documentation

## 2015-11-13 DIAGNOSIS — R262 Difficulty in walking, not elsewhere classified: Secondary | ICD-10-CM | POA: Diagnosis present

## 2015-11-13 DIAGNOSIS — R29898 Other symptoms and signs involving the musculoskeletal system: Secondary | ICD-10-CM | POA: Insufficient documentation

## 2015-11-13 DIAGNOSIS — M25552 Pain in left hip: Secondary | ICD-10-CM | POA: Diagnosis not present

## 2015-11-13 DIAGNOSIS — M25561 Pain in right knee: Secondary | ICD-10-CM | POA: Insufficient documentation
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
## 2015-11-14 ENCOUNTER — Ambulatory Visit (HOSPITAL_BASED_OUTPATIENT_CLINIC_OR_DEPARTMENT_OTHER): Payer: Worker's Compensation | Admitting: Anesthesiology

## 2015-11-14 ENCOUNTER — Ambulatory Visit (HOSPITAL_BASED_OUTPATIENT_CLINIC_OR_DEPARTMENT_OTHER)
Admission: RE | Admit: 2015-11-14 | Discharge: 2015-11-14 | Disposition: A | Discharge status: Home / Self Care | Payer: Worker's Compensation | Source: Ambulatory Visit | Attending: Orthopedic Surgery | Admitting: Orthopedic Surgery

## 2015-11-14 ENCOUNTER — Encounter (HOSPITAL_BASED_OUTPATIENT_CLINIC_OR_DEPARTMENT_OTHER): Payer: Self-pay | Admitting: *Deleted

## 2015-11-14 ENCOUNTER — Encounter (HOSPITAL_BASED_OUTPATIENT_CLINIC_OR_DEPARTMENT_OTHER)
Admission: RE | Disposition: A | Discharge status: Home / Self Care | Payer: Self-pay | Source: Ambulatory Visit | Attending: Orthopedic Surgery

## 2015-11-14 DIAGNOSIS — M23221 Derangement of posterior horn of medial meniscus due to old tear or injury, right knee: Secondary | ICD-10-CM | POA: Insufficient documentation

## 2015-11-14 DIAGNOSIS — I10 Essential (primary) hypertension: Secondary | ICD-10-CM | POA: Insufficient documentation

## 2015-11-14 DIAGNOSIS — M2241 Chondromalacia patellae, right knee: Secondary | ICD-10-CM | POA: Diagnosis not present

## 2015-11-14 HISTORY — PX: KNEE ARTHROSCOPY: SHX127

## 2015-11-14 SURGERY — ARTHROSCOPY, KNEE
Anesthesia: General | Site: Knee | Laterality: Right

## 2015-11-14 MED ORDER — HYDROMORPHONE HCL 1 MG/ML IJ SOLN: 0.5000 mg | INTRAMUSCULAR | Status: DC | PRN

## 2015-11-14 MED ORDER — METHOCARBAMOL 1000 MG/10ML IJ SOLN: 500.0000 mg | Freq: Four times a day (QID) | INTRAVENOUS | Status: DC | PRN

## 2015-11-14 MED ORDER — HYDROMORPHONE HCL 1 MG/ML IJ SOLN
0.2500 mg | INTRAMUSCULAR | Status: DC | PRN
  Administered 2015-11-14: 5 mg via INTRAVENOUS
  Administered 2015-11-14: 0.5 mg via INTRAVENOUS

## 2015-11-14 MED ORDER — FENTANYL CITRATE (PF) 100 MCG/2ML IJ SOLN
INTRAMUSCULAR | Status: AC
  Filled 2015-11-14: qty 2

## 2015-11-14 MED ORDER — CHLORHEXIDINE GLUCONATE 4 % EX LIQD: 60.0000 mL | Freq: Once | CUTANEOUS | Status: DC

## 2015-11-14 MED ORDER — HYDROMORPHONE HCL 1 MG/ML IJ SOLN
INTRAMUSCULAR | Status: AC
Start: 2015-11-14 — End: 2015-11-14
  Filled 2015-11-14: qty 1

## 2015-11-14 MED ORDER — CEFAZOLIN SODIUM-DEXTROSE 2-4 GM/100ML-% IV SOLN
INTRAVENOUS | Status: AC
  Filled 2015-11-14: qty 100

## 2015-11-14 MED ORDER — GLYCOPYRROLATE 0.2 MG/ML IJ SOLN: 0.2000 mg | Freq: Once | INTRAMUSCULAR | Status: DC | PRN

## 2015-11-14 MED ORDER — ONDANSETRON HCL 4 MG PO TABS: 4.0000 mg | ORAL_TABLET | Freq: Three times a day (TID) | ORAL | Status: DC | PRN

## 2015-11-14 MED ORDER — LIDOCAINE HCL (CARDIAC) 20 MG/ML IV SOLN
INTRAVENOUS | Status: AC
  Filled 2015-11-14: qty 5

## 2015-11-14 MED ORDER — LACTATED RINGERS IV SOLN
INTRAVENOUS | Status: DC
  Administered 2015-11-14: 13:00:00 via INTRAVENOUS

## 2015-11-14 MED ORDER — PROMETHAZINE HCL 25 MG/ML IJ SOLN: 6.2500 mg | INTRAMUSCULAR | Status: DC | PRN

## 2015-11-14 MED ORDER — HYDROCODONE-ACETAMINOPHEN 5-325 MG PO TABS: 1.0000 | ORAL_TABLET | Freq: Four times a day (QID) | ORAL | Status: DC | PRN

## 2015-11-14 MED ORDER — ONDANSETRON HCL 4 MG PO TABS: 4.0000 mg | ORAL_TABLET | Freq: Four times a day (QID) | ORAL | Status: DC | PRN

## 2015-11-14 MED ORDER — HYDROCODONE-ACETAMINOPHEN 5-325 MG PO TABS: 1.0000 | ORAL_TABLET | ORAL | Status: DC | PRN

## 2015-11-14 MED ORDER — SCOPOLAMINE 1 MG/3DAYS TD PT72: 1.0000 | MEDICATED_PATCH | Freq: Once | TRANSDERMAL | Status: DC | PRN

## 2015-11-14 MED ORDER — ONDANSETRON HCL 4 MG/2ML IJ SOLN
INTRAMUSCULAR | Status: AC
  Filled 2015-11-14: qty 2

## 2015-11-14 MED ORDER — PROPOFOL 10 MG/ML IV BOLUS
INTRAVENOUS | Status: DC | PRN
  Administered 2015-11-14: 250 mg via INTRAVENOUS

## 2015-11-14 MED ORDER — ONDANSETRON HCL 4 MG/2ML IJ SOLN: 4.0000 mg | Freq: Four times a day (QID) | INTRAMUSCULAR | Status: DC | PRN

## 2015-11-14 MED ORDER — MIDAZOLAM HCL 2 MG/2ML IJ SOLN
1.0000 mg | INTRAMUSCULAR | Status: DC | PRN
  Administered 2015-11-14: 2 mg via INTRAVENOUS

## 2015-11-14 MED ORDER — METOCLOPRAMIDE HCL 5 MG/ML IJ SOLN: 5.0000 mg | Freq: Three times a day (TID) | INTRAMUSCULAR | Status: DC | PRN

## 2015-11-14 MED ORDER — LIDOCAINE HCL (CARDIAC) 20 MG/ML IV SOLN
INTRAVENOUS | Status: DC | PRN
  Administered 2015-11-14: 50 mg via INTRAVENOUS

## 2015-11-14 MED ORDER — FENTANYL CITRATE (PF) 100 MCG/2ML IJ SOLN
50.0000 ug | INTRAMUSCULAR | Status: DC | PRN
  Administered 2015-11-14 (×2): 50 ug via INTRAVENOUS

## 2015-11-14 MED ORDER — METOCLOPRAMIDE HCL 5 MG PO TABS: 5.0000 mg | ORAL_TABLET | Freq: Three times a day (TID) | ORAL | Status: DC | PRN

## 2015-11-14 MED ORDER — MIDAZOLAM HCL 2 MG/2ML IJ SOLN
INTRAMUSCULAR | Status: AC
  Filled 2015-11-14: qty 2

## 2015-11-14 MED ORDER — DEXAMETHASONE SODIUM PHOSPHATE 10 MG/ML IJ SOLN
INTRAMUSCULAR | Status: AC
  Filled 2015-11-14: qty 1

## 2015-11-14 MED ORDER — LACTATED RINGERS IV SOLN: INTRAVENOUS | Status: DC

## 2015-11-14 MED ORDER — ONDANSETRON HCL 4 MG/2ML IJ SOLN
INTRAMUSCULAR | Status: DC | PRN
  Administered 2015-11-14: 4 mg via INTRAVENOUS

## 2015-11-14 MED ORDER — METHOCARBAMOL 500 MG PO TABS: 500.0000 mg | ORAL_TABLET | Freq: Four times a day (QID) | ORAL | Status: DC | PRN

## 2015-11-14 MED ORDER — MEPERIDINE HCL 25 MG/ML IJ SOLN: 6.2500 mg | INTRAMUSCULAR | Status: DC | PRN

## 2015-11-14 MED ORDER — DEXAMETHASONE SODIUM PHOSPHATE 4 MG/ML IJ SOLN
INTRAMUSCULAR | Status: DC | PRN
  Administered 2015-11-14: 10 mg via INTRAVENOUS

## 2015-11-14 MED ORDER — CEFAZOLIN SODIUM-DEXTROSE 2-4 GM/100ML-% IV SOLN
2.0000 g | INTRAVENOUS | Status: AC
  Administered 2015-11-14: 2 g via INTRAVENOUS

## 2015-11-14 SURGICAL SUPPLY — 40 items
BANDAGE ACE 6X5 VEL STRL LF (GAUZE/BANDAGES/DRESSINGS) ×3 IMPLANT
BLADE CUDA 5.5 (BLADE) IMPLANT
BLADE CUDA GRT WHITE 3.5 (BLADE) IMPLANT
BLADE CUTTER GATOR 3.5 (BLADE) ×3 IMPLANT
BLADE CUTTER MENIS 5.5 (BLADE) IMPLANT
BLADE GREAT WHITE 4.2 (BLADE) ×2 IMPLANT
BLADE GREAT WHITE 4.2MM (BLADE) ×1
BUR OVAL 4.0 (BURR) IMPLANT
CUTTER MENISCUS  4.2MM (BLADE)
CUTTER MENISCUS 4.2MM (BLADE) IMPLANT
DRAPE ARTHROSCOPY W/POUCH 90 (DRAPES) ×3 IMPLANT
DURAPREP 26ML APPLICATOR (WOUND CARE) ×3 IMPLANT
ELECT MENISCUS 165MM 90D (ELECTRODE) IMPLANT
ELECT REM PT RETURN 9FT ADLT (ELECTROSURGICAL)
ELECTRODE REM PT RTRN 9FT ADLT (ELECTROSURGICAL) IMPLANT
GAUZE SPONGE 4X4 12PLY STRL (GAUZE/BANDAGES/DRESSINGS) ×3 IMPLANT
GAUZE XEROFORM 1X8 LF (GAUZE/BANDAGES/DRESSINGS) ×3 IMPLANT
GLOVE BIO SURGEON STRL SZ 6.5 (GLOVE) ×1 IMPLANT
GLOVE BIO SURGEONS STRL SZ 6.5 (GLOVE) ×1
GLOVE BIOGEL PI IND STRL 7.0 (GLOVE) ×1 IMPLANT
GLOVE BIOGEL PI INDICATOR 7.0 (GLOVE) ×6
GLOVE ECLIPSE 7.0 STRL STRAW (GLOVE) ×3 IMPLANT
GLOVE SURG ORTHO 8.0 STRL STRW (GLOVE) ×3 IMPLANT
GOWN STRL REUS W/ TWL LRG LVL3 (GOWN DISPOSABLE) ×2 IMPLANT
GOWN STRL REUS W/ TWL XL LVL3 (GOWN DISPOSABLE) ×1 IMPLANT
GOWN STRL REUS W/TWL LRG LVL3 (GOWN DISPOSABLE) ×6
GOWN STRL REUS W/TWL XL LVL3 (GOWN DISPOSABLE) ×8 IMPLANT
HOLDER KNEE FOAM BLUE (MISCELLANEOUS) ×3 IMPLANT
IV NS IRRIG 3000ML ARTHROMATIC (IV SOLUTION) ×6 IMPLANT
KNEE WRAP E Z 3 GEL PACK (MISCELLANEOUS) ×3 IMPLANT
MANIFOLD NEPTUNE II (INSTRUMENTS) ×3 IMPLANT
PACK ARTHROSCOPY DSU (CUSTOM PROCEDURE TRAY) ×3 IMPLANT
PACK BASIN DAY SURGERY FS (CUSTOM PROCEDURE TRAY) ×3 IMPLANT
PENCIL BUTTON HOLSTER BLD 10FT (ELECTRODE) IMPLANT
SET ARTHROSCOPY TUBING (MISCELLANEOUS) ×3
SET ARTHROSCOPY TUBING LN (MISCELLANEOUS) ×1 IMPLANT
SUT ETHILON 3 0 PS 1 (SUTURE) ×3 IMPLANT
SUT VIC AB 3-0 FS2 27 (SUTURE) IMPLANT
TOWEL OR 17X24 6PK STRL BLUE (TOWEL DISPOSABLE) ×3 IMPLANT
WATER STERILE IRR 1000ML POUR (IV SOLUTION) ×3 IMPLANT

## 2015-11-14 NOTE — Discharge Instructions (Signed)

## 2015-11-14 NOTE — Interval H&P Note (Signed)
History and Physical Interval Note:  11/14/2015 7:31 AM  Alexander Hernandez  has presented today for surgery, with the diagnosis of [place]  The various methods of treatment have been discussed with the patient and family. After consideration of risks, benefits and other options for treatment, the patient has consented to  Procedure(s): RIGHT ARTHROSCOPY KNEE MEDIAL MENISCECTOMY,CHONDROPLASTY (Right) as a surgical intervention .  The patient's history has been reviewed, patient examined, no change in status, stable for surgery.  I have reviewed the patient's chart and labs.  Questions were answered to the patient's satisfaction.     Ninetta Lights

## 2015-11-14 NOTE — Transfer of Care (Signed)
Immediate Anesthesia Transfer of Care Note  Patient: Alexander Hernandez  Procedure(s) Performed: Procedure(s): RIGHT ARTHROSCOPY KNEE MEDIAL MENISCECTOMY,CHONDROPLASTY (Right)  Patient Location: PACU  Anesthesia Type:General  Level of Consciousness: awake, alert  and oriented  Airway & Oxygen Therapy: Patient Spontanous Breathing and Patient connected to face mask oxygen  Post-op Assessment: Report given to RN and Post -op Vital signs reviewed and stable  Post vital signs: Reviewed and stable  Last Vitals:  Filed Vitals:   11/14/15 1226  BP: 138/81  Pulse: 62  Temp: 36.8 C  Resp: 16    Complications: No apparent anesthesia complications

## 2015-11-14 NOTE — Anesthesia Preprocedure Evaluation (Addendum)
Anesthesia Evaluation  Patient identified by MRN, date of birth, ID band Patient awake    Reviewed: Allergy & Precautions, NPO status , Patient's Chart, lab work & pertinent test results  History of Anesthesia Complications (+) PONV and history of anesthetic complications  Airway Mallampati: II  TM Distance: >3 FB Neck ROM: Full    Dental  (+) Teeth Intact, Dental Advisory Given   Pulmonary neg pulmonary ROS,    breath sounds clear to auscultation       Cardiovascular hypertension,  Rhythm:Regular Rate:Normal     Neuro/Psych negative neurological ROS  negative psych ROS   GI/Hepatic negative GI ROS, Neg liver ROS,   Endo/Other  negative endocrine ROS  Renal/GU negative Renal ROS  negative genitourinary   Musculoskeletal negative musculoskeletal ROS (+)   Abdominal Normal abdominal exam  (+)   Peds negative pediatric ROS (+)  Hematology negative hematology ROS (+)   Anesthesia Other Findings   Reproductive/Obstetrics negative OB ROS                             EKG reviewed   Anesthesia Physical Anesthesia Plan  ASA: II  Anesthesia Plan: General   Post-op Pain Management:    Induction: Intravenous  Airway Management Planned: LMA  Additional Equipment:   Intra-op Plan:   Post-operative Plan: Extubation in OR  Informed Consent: I have reviewed the patients History and Physical, chart, labs and discussed the procedure including the risks, benefits and alternatives for the proposed anesthesia with the patient or authorized representative who has indicated his/her understanding and acceptance.   Dental advisory given  Plan Discussed with: CRNA  Anesthesia Plan Comments:         Anesthesia Quick Evaluation

## 2015-11-14 NOTE — Anesthesia Procedure Notes (Signed)
Procedure Name: LMA Insertion Date/Time: 11/14/2015 1:23 PM Performed by: Lieutenant Diego Pre-anesthesia Checklist: Patient identified, Emergency Drugs available, Suction available and Patient being monitored Patient Re-evaluated:Patient Re-evaluated prior to inductionOxygen Delivery Method: Circle System Utilized Preoxygenation: Pre-oxygenation with 100% oxygen Intubation Type: IV induction Ventilation: Mask ventilation without difficulty LMA: LMA inserted LMA Size: 4.0 Number of attempts: 1 Airway Equipment and Method: Bite block Placement Confirmation: positive ETCO2 and breath sounds checked- equal and bilateral Tube secured with: Tape Dental Injury: Teeth and Oropharynx as per pre-operative assessment

## 2015-11-14 NOTE — Anesthesia Postprocedure Evaluation (Signed)
Anesthesia Post Note  Patient: Alexander Hernandez  Procedure(s) Performed: Procedure(s) (LRB): RIGHT ARTHROSCOPY KNEE MEDIAL MENISCECTOMY,CHONDROPLASTY (Right)  Patient location during evaluation: PACU Anesthesia Type: General Level of consciousness: awake and alert Pain management: pain level controlled Vital Signs Assessment: post-procedure vital signs reviewed and stable Respiratory status: spontaneous breathing, nonlabored ventilation, respiratory function stable and patient connected to nasal cannula oxygen Cardiovascular status: blood pressure returned to baseline and stable Postop Assessment: no signs of nausea or vomiting Anesthetic complications: no    Last Vitals:  Filed Vitals:   11/14/15 1457 11/14/15 1500  BP:  119/88  Pulse: 80 75  Temp:    Resp: 18 16    Last Pain:  Filed Vitals:   11/14/15 1507  PainSc: [name]

## 2015-11-15 ENCOUNTER — Encounter (HOSPITAL_BASED_OUTPATIENT_CLINIC_OR_DEPARTMENT_OTHER): Payer: Self-pay | Admitting: Orthopedic Surgery

## 2015-11-15 NOTE — Op Note (Signed)
NAME:  Alexander Hernandez, Alexander Hernandez NO.:  [id number]  MEDICAL RECORD NO.:  [id number]  LOCATION:                                 FACILITY:  PHYSICIAN:  Ninetta Lights, M.D. DATE OF BIRTH:  [birth year]  DATE OF PROCEDURE:  11/14/2015 DATE OF DISCHARGE:  11/14/2015                              OPERATIVE REPORT   PREOPERATIVE DIAGNOSIS:  Right knee recurrent medial meniscus tear.  POSTOPERATIVE DIAGNOSIS:  Right knee recurrent medial meniscus tear with radial tearing of what was left of the posterior horn.  Grade 2 chondromalacia patella.  Anterior cruciate ligament, mild-to-moderate rotational laxity without significant tear, but previous stretch injury.  PROCEDURE:  Right knee exam under anesthesia, arthroscopy.  Partial medial meniscectomy.  Chondroplasty patella.  SURGEON:  Ninetta Lights, MD  ASSISTANT:  Elmyra Ricks, PA  ANESTHESIA:  General.  BLOOD LOSS:  Minimal.  SPECIMENS:  None.  CULTURES:  None.  COMPLICATIONS:  None.  DRESSINGS:  Soft compressive.  TOURNIQUET:  Not employed.  PROCEDURE IN DETAIL:  Patient was brought to the operating room, placed on the operating table in supine position.  After adequate anesthesia had been obtained, leg holder applied.  Leg prepped and draped in usual sterile fashion.  Two portals, one each medial and lateral parapatellar. Arthroscope introduced, knee distended inspected.  Good patellar tracking.  Some grade 2 changes of patella and trochlea debrided. Lateral meniscus, lateral compartment normal.  ACL appeared completely intact as did the PCL but with Lachman and pivoting and drawer test, this has a lot of excursion and you can actually pivot slip him.  It does come to an end point.  Medially condyle intact.  Partial meniscectomy but radial tearing of what was left to the posterior 3rd and junction of the middle 3rd. This was taken out further to a stable rim, tapered it smoothly.  All of the structures  examined.  No other findings appreciated.  Instruments and fluid removed.  Portals closed with nylon.  Sterile compressive dressing applied.  Anesthesia reversed.  Brought to the recovery room. Tolerated the surgery well.  No complications.     Ninetta Lights, M.D.     DFM/MEDQ  D:  11/14/2015  T:  11/14/2015  Job:  [id number]

## 2015-11-18 ENCOUNTER — Ambulatory Visit: Payer: 59 | Admitting: Physical Therapy

## 2015-11-18 ENCOUNTER — Encounter: Payer: Self-pay | Admitting: Physical Therapy

## 2015-11-18 DIAGNOSIS — M25552 Pain in left hip: Secondary | ICD-10-CM

## 2015-11-18 DIAGNOSIS — M25652 Stiffness of left hip, not elsewhere classified: Secondary | ICD-10-CM

## 2015-11-18 DIAGNOSIS — M6281 Muscle weakness (generalized): Secondary | ICD-10-CM

## 2015-11-18 DIAGNOSIS — R262 Difficulty in walking, not elsewhere classified: Secondary | ICD-10-CM
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
## 2015-11-20 ENCOUNTER — Ambulatory Visit: Payer: 59 | Admitting: Physical Therapy

## 2015-11-21 NOTE — Addendum Note (Signed)
Addended by: Sumner Boast on: 11/21/2015 04:27 PM   Modules accepted: Orders

## 2015-11-26 ENCOUNTER — Ambulatory Visit: Payer: 59 | Admitting: Physical Therapy

## 2015-11-26 ENCOUNTER — Encounter: Payer: Self-pay | Admitting: Physical Therapy

## 2015-11-26 DIAGNOSIS — M25552 Pain in left hip: Secondary | ICD-10-CM | POA: Diagnosis not present

## 2015-11-26 DIAGNOSIS — M6281 Muscle weakness (generalized): Secondary | ICD-10-CM

## 2015-11-26 DIAGNOSIS — R262 Difficulty in walking, not elsewhere classified: Secondary | ICD-10-CM

## 2015-11-26 DIAGNOSIS — M25652 Stiffness of left hip, not elsewhere classified: Secondary | ICD-10-CM
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
## 2015-11-28 ENCOUNTER — Ambulatory Visit: Payer: 59 | Admitting: Physical Therapy

## 2015-12-02 ENCOUNTER — Encounter: Payer: Self-pay | Admitting: Physical Therapy

## 2015-12-02 ENCOUNTER — Ambulatory Visit: Payer: 59 | Admitting: Physical Therapy

## 2015-12-02 DIAGNOSIS — R262 Difficulty in walking, not elsewhere classified: Secondary | ICD-10-CM

## 2015-12-02 DIAGNOSIS — M25561 Pain in right knee: Secondary | ICD-10-CM

## 2015-12-02 DIAGNOSIS — M25552 Pain in left hip: Secondary | ICD-10-CM | POA: Diagnosis not present
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
## 2015-12-04 ENCOUNTER — Ambulatory Visit: Payer: 59 | Admitting: Physical Therapy

## 2015-12-05 ENCOUNTER — Encounter: Payer: Self-pay | Admitting: Physical Therapy

## 2015-12-05 ENCOUNTER — Ambulatory Visit: Payer: 59 | Admitting: Physical Therapy

## 2015-12-05 DIAGNOSIS — R262 Difficulty in walking, not elsewhere classified: Secondary | ICD-10-CM

## 2015-12-05 DIAGNOSIS — M6281 Muscle weakness (generalized): Secondary | ICD-10-CM

## 2015-12-05 DIAGNOSIS — M25552 Pain in left hip: Secondary | ICD-10-CM

## 2015-12-05 DIAGNOSIS — M25561 Pain in right knee: Secondary | ICD-10-CM
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
## 2015-12-09 ENCOUNTER — Ambulatory Visit: Payer: 59 | Admitting: Physical Therapy

## 2015-12-11 ENCOUNTER — Ambulatory Visit: Payer: 59 | Admitting: Physical Therapy

## 2015-12-12 ENCOUNTER — Ambulatory Visit (INDEPENDENT_AMBULATORY_CARE_PROVIDER_SITE_OTHER): Payer: 59 | Admitting: Family Medicine

## 2015-12-12 ENCOUNTER — Encounter: Payer: Self-pay | Admitting: Family Medicine

## 2015-12-12 VITALS — BP 124/82 | HR 87 | Temp 98.1°F | Resp 16 | Wt 194.5 lb

## 2015-12-12 DIAGNOSIS — F411 Generalized anxiety disorder: Secondary | ICD-10-CM | POA: Insufficient documentation

## 2015-12-12 DIAGNOSIS — R413 Other amnesia: Secondary | ICD-10-CM | POA: Insufficient documentation

## 2015-12-12 MED ORDER — ESCITALOPRAM OXALATE 10 MG PO TABS: 10.0000 mg | ORAL_TABLET | Freq: Every day | ORAL | Status: DC

## 2015-12-12 NOTE — Assessment & Plan Note (Signed)
New.  Suspect this is due to anxiety but due to family hx of dementia in grandmother, pt is fearful of early onset symptoms.  Based on this, will refer to neuro for a complete work up but reviewed my suspicion that this is anxiety related.  Pt understands this but wife is a Marine scientist and insisting an evaluation.  Will follow.

## 2015-12-12 NOTE — Patient Instructions (Signed)
Follow up in 6 weeks to recheck anxiety Start the Lexapro once daily We'll call you with your neuro appt for a complete work up on the memory issues but I think this is stress related Ask Workers' Comp if you can get a 2nd opinion on the knee Try and find a stress outlet- you deserve it! Call with any questions or concerns Hang in there!!!

## 2015-12-12 NOTE — Progress Notes (Signed)
Pre visit review using our clinic review tool, if applicable. No additional management support is needed unless otherwise documented below in the visit note. 

## 2015-12-12 NOTE — Assessment & Plan Note (Signed)
New.  Pt clearly has a lot going on and is having a hard time managing everything.  Feels wound up at all times.  Not able to enjoy things.  Not able to relax.  I suspect that this is the cause of his memory loss.  After a discussion of his sxs, he is in agreement to try low dose SSRI.  Will start Lexapro.  Reviewed supportive care and red flags that should prompt return.  Pt expressed understanding and is in agreement w/ plan.

## 2015-12-12 NOTE — Progress Notes (Signed)
   Subjective:    Patient ID: Alexander Hernandez, male    DOB: May 02, 1976, 40 y.o.   MRN: [id number]  HPI Memory loss- pt has had hip resurfacing and 2 knee surgeries since I last saw him.  Has upcoming ACL repair and possible knee replacement.  Pt admits to increased stress level.  Pt having difficulty remembering things- doesn't recall things said during conversations, difficulty recalling faces in public, misplacing things.  sxs started ~2 yrs ago.  + family hx of dementia.  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  Has financial concerns.  Pt admits to having a hard time enjoying things.   Review of Systems For ROS see HPI     Objective:   Physical Exam  Constitutional: He is oriented to person, place, and time. He appears well-developed and well-nourished. No distress.  HENT:  Head: Normocephalic and atraumatic.  Eyes: Conjunctivae and EOM are normal. Pupils are equal, round, and reactive to light.  Neurological: He is alert and oriented to person, place, and time.  Skin: Skin is warm and dry.  Psychiatric: Thought content normal.  Anxious, psychomotor agitation  Vitals reviewed.         Assessment & Plan:

## 2015-12-18 ENCOUNTER — Ambulatory Visit: Payer: 59 | Attending: Orthopedic Surgery | Admitting: Physical Therapy

## 2015-12-18 ENCOUNTER — Encounter: Payer: Self-pay | Admitting: Physical Therapy

## 2015-12-18 DIAGNOSIS — R262 Difficulty in walking, not elsewhere classified: Secondary | ICD-10-CM | POA: Diagnosis present

## 2015-12-18 DIAGNOSIS — M25561 Pain in right knee: Secondary | ICD-10-CM | POA: Insufficient documentation

## 2015-12-18 DIAGNOSIS — M6281 Muscle weakness (generalized): Secondary | ICD-10-CM | POA: Insufficient documentation

## 2015-12-18 DIAGNOSIS — M25552 Pain in left hip: Secondary | ICD-10-CM | POA: Diagnosis present

## 2015-12-18 DIAGNOSIS — M25652 Stiffness of left hip, not elsewhere classified: Secondary | ICD-10-CM | POA: Diagnosis present
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
## 2015-12-19 ENCOUNTER — Ambulatory Visit: Payer: 59 | Admitting: Physical Therapy

## 2015-12-24 ENCOUNTER — Ambulatory Visit: Payer: 59 | Admitting: Physical Therapy

## 2015-12-25 ENCOUNTER — Encounter: Payer: Self-pay | Admitting: Physical Therapy

## 2015-12-25 ENCOUNTER — Ambulatory Visit: Payer: 59 | Admitting: Physical Therapy

## 2015-12-25 DIAGNOSIS — M25652 Stiffness of left hip, not elsewhere classified: Secondary | ICD-10-CM

## 2015-12-25 DIAGNOSIS — M25561 Pain in right knee: Secondary | ICD-10-CM

## 2015-12-25 DIAGNOSIS — R262 Difficulty in walking, not elsewhere classified: Secondary | ICD-10-CM

## 2015-12-25 DIAGNOSIS — M25552 Pain in left hip: Secondary | ICD-10-CM

## 2015-12-25 DIAGNOSIS — M6281 Muscle weakness (generalized): Secondary | ICD-10-CM
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
## 2015-12-31 ENCOUNTER — Encounter: Payer: Self-pay | Admitting: Physical Therapy

## 2015-12-31 ENCOUNTER — Ambulatory Visit: Payer: 59 | Admitting: Physical Therapy

## 2015-12-31 DIAGNOSIS — R262 Difficulty in walking, not elsewhere classified: Secondary | ICD-10-CM | POA: Diagnosis not present

## 2015-12-31 DIAGNOSIS — M25552 Pain in left hip: Secondary | ICD-10-CM
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
## 2016-01-02 ENCOUNTER — Ambulatory Visit: Payer: 59 | Admitting: Physical Therapy

## 2016-01-17 ENCOUNTER — Other Ambulatory Visit (INDEPENDENT_AMBULATORY_CARE_PROVIDER_SITE_OTHER): Payer: 59

## 2016-01-17 ENCOUNTER — Ambulatory Visit (INDEPENDENT_AMBULATORY_CARE_PROVIDER_SITE_OTHER): Payer: 59 | Admitting: Neurology

## 2016-01-17 ENCOUNTER — Encounter: Payer: Self-pay | Admitting: Neurology

## 2016-01-17 VITALS — BP 118/72 | HR 87 | Ht 69.0 in | Wt 190.0 lb

## 2016-01-17 DIAGNOSIS — R413 Other amnesia: Secondary | ICD-10-CM

## 2016-01-17 DIAGNOSIS — F419 Anxiety disorder, unspecified: Secondary | ICD-10-CM | POA: Diagnosis not present

## 2016-01-17 LAB — VITAMIN B12: Vitamin B-12: 294 pg/mL (ref 211–911)

## 2016-01-17 LAB — TSH: TSH: 1.02 u[IU]/mL (ref 0.35–4.50)

## 2016-01-17 NOTE — Patient Instructions (Signed)
1.  We will first check TSH and vitamin b12 level, which deficiencies may cause memory problems. 2.  If labs okay, we will set you up for neuropsychological testing 3.  Follow up after testing.
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
## 2016-01-20 ENCOUNTER — Ambulatory Visit: Payer: Self-pay | Admitting: Family Medicine

## 2016-01-20 ENCOUNTER — Telehealth: Payer: Self-pay | Admitting: Family Medicine

## 2016-01-22 NOTE — Telephone Encounter (Signed)
Pt was no show 01/20/16 for follow up, pt has rescheduled for 01/24/16, 1st no show I see, charge or no charge?

## 2016-01-22 NOTE — Telephone Encounter (Signed)
No charge. 

## 2016-01-24 ENCOUNTER — Encounter: Payer: Self-pay | Admitting: Family Medicine

## 2016-01-24 ENCOUNTER — Ambulatory Visit: Payer: 59 | Admitting: Family Medicine

## 2016-01-24 DIAGNOSIS — Z0289 Encounter for other administrative examinations: Secondary | ICD-10-CM

## 2016-01-24 NOTE — Telephone Encounter (Signed)
Waiving fee and mailing reminder letter

## 2016-01-27 ENCOUNTER — Telehealth: Payer: Self-pay | Admitting: Family Medicine

## 2016-01-27 NOTE — Telephone Encounter (Signed)
Yes charge 

## 2016-01-27 NOTE — Telephone Encounter (Signed)
Patient was No Show on 6/12 and 6/16. Charge or No Charge?

## 2016-01-28 ENCOUNTER — Ambulatory Visit: Payer: Self-pay | Admitting: Physical Therapy

## 2016-01-28 ENCOUNTER — Encounter: Payer: Self-pay | Admitting: Family Medicine

## 2016-01-28 ENCOUNTER — Encounter: Payer: Self-pay | Admitting: Physical Therapy

## 2016-01-28 ENCOUNTER — Telehealth: Payer: Self-pay

## 2016-01-28 ENCOUNTER — Ambulatory Visit: Payer: 59 | Admitting: Physical Therapy

## 2016-01-28 ENCOUNTER — Ambulatory Visit: Payer: 59 | Attending: Orthopedic Surgery | Admitting: Physical Therapy

## 2016-01-28 DIAGNOSIS — R262 Difficulty in walking, not elsewhere classified: Secondary | ICD-10-CM | POA: Diagnosis not present

## 2016-01-28 DIAGNOSIS — M25552 Pain in left hip: Secondary | ICD-10-CM | POA: Insufficient documentation

## 2016-01-28 DIAGNOSIS — R413 Other amnesia: Secondary | ICD-10-CM

## 2016-01-28 NOTE — Telephone Encounter (Signed)
Message relayed to patient. Verbalized understanding and denied questions. Will submit neuropsych to front desk for scheduling.

## 2016-01-28 NOTE — Telephone Encounter (Signed)
-----   Message from Pieter Partridge, DO sent at 01/28/2016  7:03 AM EDT ----- B12 is within normal range, but on the low-end of normal.  I would recommend starting B12 pills, 1071mcg daily.  I would also like to refer for neuropsych testing with follow up afterwards, if this was not yet done.
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
## 2016-03-30 NOTE — H&P (Signed)
Alexander Hernandez comes in today for a follow-up. He continues to have a persistent feeling of instability of the right knee. We went through his strength testing and he is really up to full strength. The issue here is an injury to his ACL and medial meniscus tear as a work related injury. It was felt at the initial intervention that the ACL injury was not going to be profound enough to require treatment.  He subsequently had more pivoting and tore more cartilage and had a repeat arthroscope.  At the time of the second scope, in April of this year, his degree of instability had really progressed to more than a moderate degree. As of this point, his degree of instability and giving away is intolerable. He and I are concerned that this is just going to cause more damage to his knee. In addition to that, his degree of laxity is having a profound functional impact.   Past medical, social and family history reviewed in detail on the patient questionnaire and signed.  Review of systems: As detailed in the HPI. All others reviewed and are negative. General exam as outlined.   EXAMINATION: Well-developed, well-nourished male in no acute distress. Alert and oriented times three.  He definitely has increase in excursion with Lachman and drawer as well as a pivot shift at least. No meniscal sign.   PLAN:  At this point in time I do not think he will do well until he has his ACL reconstructed. The longer it goes the looser it feels and the more and more functional impact it is having.  We discussed definitive treatment with exam under anesthesia, arthroscopy, arthroscopic and endoscopic ACL reconstruction, patellar tendon autograft. The procedure, risks and complications are reviewed. Rehab, recovery and time frame of 6-9 months. Paperwork completed.  All questions were encouraged and answered. This is giving away with daily living and so bracing him is not an option. He is already up to full strength so further therapy is  really not going to be helpful. He understands and agrees. We will set this up in the near future.

## 2016-04-08 ENCOUNTER — Encounter (HOSPITAL_BASED_OUTPATIENT_CLINIC_OR_DEPARTMENT_OTHER): Payer: Self-pay | Admitting: *Deleted

## 2016-04-15 ENCOUNTER — Encounter (HOSPITAL_BASED_OUTPATIENT_CLINIC_OR_DEPARTMENT_OTHER): Payer: Self-pay | Admitting: *Deleted

## 2016-04-23 ENCOUNTER — Encounter (HOSPITAL_BASED_OUTPATIENT_CLINIC_OR_DEPARTMENT_OTHER): Payer: Self-pay | Admitting: *Deleted

## 2016-04-23 ENCOUNTER — Ambulatory Visit (HOSPITAL_BASED_OUTPATIENT_CLINIC_OR_DEPARTMENT_OTHER): Payer: Worker's Compensation | Admitting: Anesthesiology

## 2016-04-23 ENCOUNTER — Encounter (HOSPITAL_BASED_OUTPATIENT_CLINIC_OR_DEPARTMENT_OTHER)
Admission: RE | Disposition: A | Discharge status: Home / Self Care | Payer: Self-pay | Source: Ambulatory Visit | Attending: Orthopedic Surgery

## 2016-04-23 ENCOUNTER — Ambulatory Visit (HOSPITAL_BASED_OUTPATIENT_CLINIC_OR_DEPARTMENT_OTHER)
Admission: RE | Admit: 2016-04-23 | Discharge: 2016-04-23 | Disposition: A | Discharge status: Home / Self Care | Payer: Worker's Compensation | Source: Ambulatory Visit | Attending: Orthopedic Surgery | Admitting: Orthopedic Surgery

## 2016-04-23 DIAGNOSIS — S83281A Other tear of lateral meniscus, current injury, right knee, initial encounter: Secondary | ICD-10-CM | POA: Insufficient documentation

## 2016-04-23 DIAGNOSIS — S83511A Sprain of anterior cruciate ligament of right knee, initial encounter: Secondary | ICD-10-CM | POA: Diagnosis present

## 2016-04-23 DIAGNOSIS — F419 Anxiety disorder, unspecified: Secondary | ICD-10-CM | POA: Diagnosis not present

## 2016-04-23 DIAGNOSIS — I10 Essential (primary) hypertension: Secondary | ICD-10-CM | POA: Insufficient documentation

## 2016-04-23 DIAGNOSIS — X58XXXA Exposure to other specified factors, initial encounter: Secondary | ICD-10-CM | POA: Diagnosis not present

## 2016-04-23 DIAGNOSIS — R7303 Prediabetes: Secondary | ICD-10-CM | POA: Insufficient documentation

## 2016-04-23 DIAGNOSIS — K219 Gastro-esophageal reflux disease without esophagitis: Secondary | ICD-10-CM | POA: Insufficient documentation

## 2016-04-23 HISTORY — DX: Prediabetes: R73.03

## 2016-04-23 HISTORY — PX: KNEE ARTHROSCOPY WITH LATERAL MENISECTOMY: SHX6193

## 2016-04-23 HISTORY — PX: KNEE ARTHROSCOPY WITH ANTERIOR CRUCIATE LIGAMENT (ACL) REPAIR: SHX5644

## 2016-04-23 SURGERY — KNEE ARTHROSCOPY WITH ANTERIOR CRUCIATE LIGAMENT (ACL) REPAIR
Anesthesia: Regional | Site: Knee | Laterality: Right

## 2016-04-23 MED ORDER — LIDOCAINE 2% (20 MG/ML) 5 ML SYRINGE
INTRAMUSCULAR | Status: AC
  Filled 2016-04-23: qty 5

## 2016-04-23 MED ORDER — MIDAZOLAM HCL 2 MG/2ML IJ SOLN
1.0000 mg | INTRAMUSCULAR | Status: DC | PRN
  Administered 2016-04-23: 2 mg via INTRAVENOUS

## 2016-04-23 MED ORDER — BUPIVACAINE-EPINEPHRINE (PF) 0.5% -1:200000 IJ SOLN
INTRAMUSCULAR | Status: DC | PRN
  Administered 2016-04-23: 30 mL via PERINEURAL

## 2016-04-23 MED ORDER — SODIUM CHLORIDE 0.9 % IR SOLN
Status: DC | PRN
  Administered 2016-04-23: 9000 mL

## 2016-04-23 MED ORDER — GLYCOPYRROLATE 0.2 MG/ML IJ SOLN: 0.2000 mg | Freq: Once | INTRAMUSCULAR | Status: DC | PRN

## 2016-04-23 MED ORDER — FENTANYL CITRATE (PF) 100 MCG/2ML IJ SOLN
INTRAMUSCULAR | Status: AC
  Filled 2016-04-23: qty 2

## 2016-04-23 MED ORDER — ACETAMINOPHEN 325 MG PO TABS: 325.0000 mg | ORAL_TABLET | ORAL | Status: DC | PRN

## 2016-04-23 MED ORDER — ACETAMINOPHEN 160 MG/5ML PO SOLN: 325.0000 mg | ORAL | Status: DC | PRN

## 2016-04-23 MED ORDER — ONDANSETRON HCL 4 MG PO TABS: 4.0000 mg | ORAL_TABLET | Freq: Four times a day (QID) | ORAL | Status: DC | PRN

## 2016-04-23 MED ORDER — CHLORHEXIDINE GLUCONATE 4 % EX LIQD: 60.0000 mL | Freq: Once | CUTANEOUS | Status: DC

## 2016-04-23 MED ORDER — HYDROMORPHONE HCL 1 MG/ML IJ SOLN
0.5000 mg | INTRAMUSCULAR | Status: DC | PRN
Start: 2016-04-23 — End: 2016-04-23

## 2016-04-23 MED ORDER — DEXAMETHASONE SODIUM PHOSPHATE 10 MG/ML IJ SOLN
INTRAMUSCULAR | Status: AC
  Filled 2016-04-23: qty 1

## 2016-04-23 MED ORDER — MORPHINE SULFATE (PF) 10 MG/ML IV SOLN
INTRAVENOUS | Status: AC
  Filled 2016-04-23: qty 1

## 2016-04-23 MED ORDER — DEXAMETHASONE SODIUM PHOSPHATE 10 MG/ML IJ SOLN
INTRAMUSCULAR | Status: DC | PRN
  Administered 2016-04-23: 10 mg via INTRAVENOUS

## 2016-04-23 MED ORDER — CEFAZOLIN SODIUM-DEXTROSE 2-4 GM/100ML-% IV SOLN
2.0000 g | INTRAVENOUS | Status: AC
  Administered 2016-04-23: 2 g via INTRAVENOUS

## 2016-04-23 MED ORDER — DOCUSATE SODIUM 100 MG PO CAPS: 100.0000 mg | ORAL_CAPSULE | Freq: Two times a day (BID) | ORAL | Status: DC

## 2016-04-23 MED ORDER — OXYCODONE-ACETAMINOPHEN 5-325 MG PO TABS
1.0000 | ORAL_TABLET | ORAL | Status: DC | PRN
  Administered 2016-04-23: 1 via ORAL

## 2016-04-23 MED ORDER — LIDOCAINE 2% (20 MG/ML) 5 ML SYRINGE
INTRAMUSCULAR | Status: DC | PRN
  Administered 2016-04-23: 100 mg via INTRAVENOUS

## 2016-04-23 MED ORDER — METHOCARBAMOL 500 MG PO TABS: 500.0000 mg | ORAL_TABLET | Freq: Four times a day (QID) | ORAL | Status: DC | PRN

## 2016-04-23 MED ORDER — PROMETHAZINE HCL 25 MG/ML IJ SOLN: 6.2500 mg | INTRAMUSCULAR | Status: DC | PRN

## 2016-04-23 MED ORDER — MORPHINE SULFATE 10 MG/ML IJ SOLN
INTRAMUSCULAR | Status: DC | PRN
  Administered 2016-04-23: 4 mg via INTRAVENOUS
  Administered 2016-04-23: 2 mg via INTRAVENOUS

## 2016-04-23 MED ORDER — PROPOFOL 10 MG/ML IV BOLUS
INTRAVENOUS | Status: DC | PRN
  Administered 2016-04-23: 200 mg via INTRAVENOUS

## 2016-04-23 MED ORDER — ONDANSETRON HCL 4 MG/2ML IJ SOLN
INTRAMUSCULAR | Status: AC
  Filled 2016-04-23: qty 2

## 2016-04-23 MED ORDER — SCOPOLAMINE 1 MG/3DAYS TD PT72
1.0000 | MEDICATED_PATCH | TRANSDERMAL | Status: DC
  Administered 2016-04-23: 1.5 mg via TRANSDERMAL

## 2016-04-23 MED ORDER — CEFAZOLIN SODIUM-DEXTROSE 2-4 GM/100ML-% IV SOLN
INTRAVENOUS | Status: AC
  Filled 2016-04-23: qty 100

## 2016-04-23 MED ORDER — SODIUM CHLORIDE 0.9 % IV SOLN: INTRAVENOUS | Status: DC

## 2016-04-23 MED ORDER — OXYCODONE-ACETAMINOPHEN 5-325 MG PO TABS
ORAL_TABLET | ORAL | Status: AC
  Filled 2016-04-23: qty 1

## 2016-04-23 MED ORDER — METHOCARBAMOL 1000 MG/10ML IJ SOLN: 500.0000 mg | Freq: Four times a day (QID) | INTRAVENOUS | Status: DC | PRN

## 2016-04-23 MED ORDER — FENTANYL CITRATE (PF) 100 MCG/2ML IJ SOLN
50.0000 ug | INTRAMUSCULAR | Status: AC | PRN
  Administered 2016-04-23: 50 ug via INTRAVENOUS
  Administered 2016-04-23 (×2): 100 ug via INTRAVENOUS

## 2016-04-23 MED ORDER — ONDANSETRON HCL 4 MG/2ML IJ SOLN: 4.0000 mg | Freq: Four times a day (QID) | INTRAMUSCULAR | Status: DC | PRN

## 2016-04-23 MED ORDER — METOCLOPRAMIDE HCL 5 MG/ML IJ SOLN: 5.0000 mg | Freq: Three times a day (TID) | INTRAMUSCULAR | Status: DC | PRN

## 2016-04-23 MED ORDER — LACTATED RINGERS IV SOLN: INTRAVENOUS | Status: DC

## 2016-04-23 MED ORDER — SCOPOLAMINE 1 MG/3DAYS TD PT72: 1.0000 | MEDICATED_PATCH | Freq: Once | TRANSDERMAL | Status: DC | PRN

## 2016-04-23 MED ORDER — LACTATED RINGERS IV SOLN
INTRAVENOUS | Status: DC
  Administered 2016-04-23: 10 mL/h via INTRAVENOUS
  Administered 2016-04-23: 13:00:00 via INTRAVENOUS

## 2016-04-23 MED ORDER — METOCLOPRAMIDE HCL 5 MG PO TABS: 5.0000 mg | ORAL_TABLET | Freq: Three times a day (TID) | ORAL | Status: DC | PRN

## 2016-04-23 MED ORDER — MIDAZOLAM HCL 2 MG/2ML IJ SOLN
INTRAMUSCULAR | Status: AC
  Filled 2016-04-23: qty 2

## 2016-04-23 MED ORDER — FENTANYL CITRATE (PF) 100 MCG/2ML IJ SOLN
25.0000 ug | INTRAMUSCULAR | Status: DC | PRN
  Administered 2016-04-23: 25 ug via INTRAVENOUS

## 2016-04-23 MED ORDER — SCOPOLAMINE 1 MG/3DAYS TD PT72
MEDICATED_PATCH | TRANSDERMAL | Status: AC
  Filled 2016-04-23: qty 1

## 2016-04-23 SURGICAL SUPPLY — 95 items
APL SKNCLS STERI-STRIP NONHPOA (GAUZE/BANDAGES/DRESSINGS)
BANDAGE ACE 4X5 VEL STRL LF (GAUZE/BANDAGES/DRESSINGS) ×4 IMPLANT
BANDAGE ACE 6X5 VEL STRL LF (GAUZE/BANDAGES/DRESSINGS) ×4 IMPLANT
BANDAGE ESMARK 6X9 LF (GAUZE/BANDAGES/DRESSINGS) ×2 IMPLANT
BENZOIN TINCTURE PRP APPL 2/3 (GAUZE/BANDAGES/DRESSINGS) ×2 IMPLANT
BIOSCREW 8X25 (Screw) ×4 IMPLANT
BIOSCREW 9MMX25MM (Screw) ×1 IMPLANT
BIOSCREW 9X25 (Screw) ×3 IMPLANT
BIT DRILL QC 3.5X195 (BIT) IMPLANT
BLADE 4.2CUDA (BLADE) IMPLANT
BLADE AVERAGE 25MMX9MM (BLADE) ×1
BLADE AVERAGE 25X9 (BLADE) ×3 IMPLANT
BLADE CUDA 5.5 (BLADE) IMPLANT
BLADE CUDA GRT WHITE 3.5 (BLADE) IMPLANT
BLADE CUTTER GATOR 3.5 (BLADE) ×4 IMPLANT
BLADE CUTTER MENIS 5.5 (BLADE) IMPLANT
BLADE GREAT WHITE 4.2 (BLADE) ×3 IMPLANT
BLADE GREAT WHITE 4.2MM (BLADE) ×1
BLADE SURG 15 STRL LF DISP TIS (BLADE) ×2 IMPLANT
BLADE SURG 15 STRL SS (BLADE) ×4
BNDG CMPR 9X6 STRL LF SNTH (GAUZE/BANDAGES/DRESSINGS) ×2
BNDG ESMARK 6X9 LF (GAUZE/BANDAGES/DRESSINGS) ×4
BUR OVAL 6.0 (BURR) ×4 IMPLANT
CLOSURE WOUND 1/2 X4 (GAUZE/BANDAGES/DRESSINGS) ×1
COVER BACK TABLE 60X90IN (DRAPES) ×4 IMPLANT
CUFF TOURNIQUET SINGLE 34IN LL (TOURNIQUET CUFF) ×2 IMPLANT
CUTTER MENISCUS  4.2MM (BLADE)
CUTTER MENISCUS 4.2MM (BLADE) IMPLANT
DECANTER SPIKE VIAL GLASS SM (MISCELLANEOUS) IMPLANT
DRAPE ARTHROSCOPY W/POUCH 114 (DRAPES) ×4 IMPLANT
DRAPE ORTHO SPLIT 77X108 STRL (DRAPES) ×4
DRAPE SURG ORHT 6 SPLT 77X108 (DRAPES) IMPLANT
DRAPE U-SHAPE 47X51 STRL (DRAPES) ×4 IMPLANT
DURAPREP 26ML APPLICATOR (WOUND CARE) ×4 IMPLANT
ELECT MENISCUS 165MM 90D (ELECTRODE) ×2 IMPLANT
ELECT REM PT RETURN 9FT ADLT (ELECTROSURGICAL) ×4
ELECTRODE REM PT RTRN 9FT ADLT (ELECTROSURGICAL) ×2 IMPLANT
GAUZE SPONGE 4X4 12PLY STRL (GAUZE/BANDAGES/DRESSINGS) ×4 IMPLANT
GAUZE XEROFORM 1X8 LF (GAUZE/BANDAGES/DRESSINGS) ×2 IMPLANT
GLOVE BIO SURGEON STRL SZ7.5 (GLOVE) ×2 IMPLANT
GLOVE BIO SURGEON STRL SZ8 (GLOVE) ×2 IMPLANT
GLOVE BIOGEL PI IND STRL 7.0 (GLOVE) ×2 IMPLANT
GLOVE BIOGEL PI IND STRL 7.5 (GLOVE) IMPLANT
GLOVE BIOGEL PI IND STRL 8 (GLOVE) IMPLANT
GLOVE BIOGEL PI INDICATOR 7.0 (GLOVE) ×4
GLOVE BIOGEL PI INDICATOR 7.5 (GLOVE) ×2
GLOVE BIOGEL PI INDICATOR 8 (GLOVE) ×2
GLOVE ECLIPSE 6.5 STRL STRAW (GLOVE) ×2 IMPLANT
GLOVE ECLIPSE 7.0 STRL STRAW (GLOVE) ×4 IMPLANT
GLOVE SURG ORTHO 8.0 STRL STRW (GLOVE) ×4 IMPLANT
GOWN STRL REUS W/ TWL LRG LVL3 (GOWN DISPOSABLE) ×2 IMPLANT
GOWN STRL REUS W/ TWL XL LVL3 (GOWN DISPOSABLE) ×4 IMPLANT
GOWN STRL REUS W/TWL LRG LVL3 (GOWN DISPOSABLE) ×4
GOWN STRL REUS W/TWL XL LVL3 (GOWN DISPOSABLE) ×8
IMMOBILIZER KNEE 22 UNIV (SOFTGOODS) ×2 IMPLANT
IMMOBILIZER KNEE 24 THIGH 36 (MISCELLANEOUS) ×2 IMPLANT
IMMOBILIZER KNEE 24 UNIV (MISCELLANEOUS) ×4
IV NS IRRIG 3000ML ARTHROMATIC (IV SOLUTION) ×12 IMPLANT
KNEE WRAP E Z 3 GEL PACK (MISCELLANEOUS) ×4 IMPLANT
KNIFE GRAFT ACL 10MM 5952 (MISCELLANEOUS) ×2 IMPLANT
KNIFE GRAFT ACL 9MM (MISCELLANEOUS) IMPLANT
MANIFOLD NEPTUNE II (INSTRUMENTS) ×4 IMPLANT
NS IRRIG 1000ML POUR BTL (IV SOLUTION) ×4 IMPLANT
PACK ARTHROSCOPY DSU (CUSTOM PROCEDURE TRAY) ×4 IMPLANT
PACK BASIN DAY SURGERY FS (CUSTOM PROCEDURE TRAY) ×4 IMPLANT
PAD CAST 4YDX4 CTTN HI CHSV (CAST SUPPLIES) ×2 IMPLANT
PADDING CAST COTTON 4X4 STRL (CAST SUPPLIES) ×4
PADDING CAST COTTON 6X4 STRL (CAST SUPPLIES) ×2 IMPLANT
PASSER SUT SWANSON 36MM LOOP (INSTRUMENTS) ×4 IMPLANT
PENCIL BUTTON HOLSTER BLD 10FT (ELECTRODE) ×4 IMPLANT
SCREW BIO 8X25 (Screw) IMPLANT
SCREW BIO 9X25 (Screw) IMPLANT
SET ARTHROSCOPY TUBING (MISCELLANEOUS) ×4
SET ARTHROSCOPY TUBING LN (MISCELLANEOUS) ×2 IMPLANT
SLEEVE SCD COMPRESS KNEE MED (MISCELLANEOUS) ×2 IMPLANT
SPONGE LAP 4X18 X RAY DECT (DISPOSABLE) ×2 IMPLANT
STRIP CLOSURE SKIN 1/2X4 (GAUZE/BANDAGES/DRESSINGS) ×3 IMPLANT
SUCTION FRAZIER HANDLE 10FR (MISCELLANEOUS)
SUCTION TUBE FRAZIER 10FR DISP (MISCELLANEOUS) IMPLANT
SUT 2 FIBERLOOP 20 STRT BLUE (SUTURE) ×4
SUT ETHILON 3 0 PS 1 (SUTURE) IMPLANT
SUT FIBERWIRE #2 38 T-5 BLUE (SUTURE) ×16
SUT MNCRL AB 3-0 PS2 18 (SUTURE) ×2 IMPLANT
SUT VIC AB 0 CT1 27 (SUTURE) ×4
SUT VIC AB 0 CT1 27XBRD ANBCTR (SUTURE) IMPLANT
SUT VIC AB 2-0 SH 27 (SUTURE) ×4
SUT VIC AB 2-0 SH 27XBRD (SUTURE) ×2 IMPLANT
SUT VIC AB 3-0 SH 27 (SUTURE)
SUT VIC AB 3-0 SH 27X BRD (SUTURE) IMPLANT
SUT VICRYL 4-0 PS2 18IN ABS (SUTURE) IMPLANT
SUTURE 2 FIBERLOOP 20 STRT BLU (SUTURE) ×2 IMPLANT
SUTURE FIBERWR #2 38 T-5 BLUE (SUTURE) ×8 IMPLANT
TOWEL OR 17X24 6PK STRL BLUE (TOWEL DISPOSABLE) ×4 IMPLANT
TOWEL OR NON WOVEN STRL DISP B (DISPOSABLE) ×4 IMPLANT
WATER STERILE IRR 1000ML POUR (IV SOLUTION) ×4 IMPLANT

## 2016-04-23 NOTE — Anesthesia Procedure Notes (Signed)
Procedure Name: LMA Insertion Date/Time: 04/23/2016 12:00 PM Performed by: Lieutenant Diego Pre-anesthesia Checklist: Patient identified, Emergency Drugs available, Suction available and Patient being monitored Patient Re-evaluated:Patient Re-evaluated prior to inductionOxygen Delivery Method: Circle system utilized Preoxygenation: Pre-oxygenation with 100% oxygen Intubation Type: IV induction Ventilation: Mask ventilation without difficulty LMA: LMA inserted LMA Size: 5.0 Number of attempts: 1 Airway Equipment and Method: Bite block Placement Confirmation: positive ETCO2 and breath sounds checked- equal and bilateral Tube secured with: Tape Dental Injury: Teeth and Oropharynx as per pre-operative assessment

## 2016-04-23 NOTE — Progress Notes (Signed)
Assisted Dr. Neoma Laming with right, ultrasound guided, femoral block. Side rails up, monitors on throughout procedure. See vital signs in flow sheet. Tolerated Procedure well.

## 2016-04-23 NOTE — Anesthesia Postprocedure Evaluation (Signed)
Anesthesia Post Note  Patient: Alexander Hernandez  Procedure(s) Performed: Procedure(s) (LRB): RIGHT KNEE ARTHROSCOPY  ANTERIOR CRUCIATE LIGAMENT (ACL) PATELLAR TENDON AUTOGRAFT (Right) KNEE ARTHROSCOPY WITH LATERAL MENISCAL DEBRIDEMENT  Patient location during evaluation: PACU Anesthesia Type: General and Regional Level of consciousness: awake and alert Pain management: pain level controlled Vital Signs Assessment: post-procedure vital signs reviewed and stable Respiratory status: spontaneous breathing, nonlabored ventilation, respiratory function stable and patient connected to nasal cannula oxygen Cardiovascular status: blood pressure returned to baseline and stable Postop Assessment: no signs of nausea or vomiting Anesthetic complications: no    Last Vitals:  Vitals:   04/23/16 1500 04/23/16 1530  BP: 107/84 111/73  Pulse: (!) 52 (!) 50  Resp: 15 16  Temp:  36.6 C    Last Pain:  Vitals:   04/23/16 1530  TempSrc:   PainSc: [place]

## 2016-04-23 NOTE — Transfer of Care (Signed)
Immediate Anesthesia Transfer of Care Note  Patient: Alexander Hernandez  Procedure(s) Performed: Procedure(s): RIGHT KNEE ARTHROSCOPY  ANTERIOR CRUCIATE LIGAMENT (ACL) PATELLAR TENDON AUTOGRAFT (Right) KNEE ARTHROSCOPY WITH LATERAL MENISCAL DEBRIDEMENT  Patient Location: PACU  Anesthesia Type:General and GA combined with regional for post-op pain  Level of Consciousness: awake and alert   Airway & Oxygen Therapy: Patient Spontanous Breathing and Patient connected to face mask oxygen  Post-op Assessment: Report given to RN and Post -op Vital signs reviewed and stable  Post vital signs: Reviewed and stable  Last Vitals:  Vitals:   04/23/16 1130 04/23/16 1145  BP: 112/65 105/66  Pulse: (!) 52 (!) 49  Resp: (!) 0 15  Temp:      Last Pain:  Vitals:   04/23/16 0940  TempSrc: Oral         Complications: No apparent anesthesia complications

## 2016-04-23 NOTE — Discharge Instructions (Signed)
Diet: As you were doing prior to hospitalization   Activity: Increase activity slowly as tolerated  No lifting or driving for 48 hours   Shower: May shower without a dressing on post op day #3, NO SOAKING in tub   Dressing: You may change your dressing on post op day #3.  Then change the dressing daily with sterile 4"x4"s gauze dressing   Weight Bearing: weight bearing as tolerated in knee immobilizer.  To prevent constipation: you may use a stool softener such as -      Regional Anesthesia Blocks  1. Numbness or the inability to move the "blocked" extremity may last from 3-48 hours after placement. The length of time depends on the medication injected and your individual response to the medication. If the numbness is not going away after 48 hours, call your surgeon.  2. The extremity that is blocked will need to be protected until the numbness is gone and the  Strength has returned. Because you cannot feel it, you will need to take extra care to avoid injury. Because it may be weak, you may have difficulty moving it or using it. You may not know what position it is in without looking at it while the block is in effect.  3. For blocks in the legs and feet, returning to weight bearing and walking needs to be done carefully. You will need to wait until the numbness is entirely gone and the strength has returned. You should be able to move your leg and foot normally before you try and bear weight or walk. You will need someone to be with you when you first try to ensure you do not fall and possibly risk injury.  4. Bruising and tenderness at the needle site are common side effects and will resolve in a few days.  5. Persistent numbness or new problems with movement should be communicated to the surgeon or the [facility] [phone] [place] [phone]).      Post Anesthesia Home Care Instructions  Activity: Get plenty of rest for the remainder of  the day. A responsible adult should stay with you for 24 hours following the procedure.  For the next 24 hours, DO NOT: -Drive a car -Paediatric nurse -Drink alcoholic beverages -Take any medication unless instructed by your physician -Make any legal decisions or sign important papers.  Meals: Start with liquid foods such as gelatin or soup. Progress to regular foods as tolerated. Avoid greasy, spicy, heavy foods. If nausea and/or vomiting occur, drink only clear liquids until the nausea and/or vomiting subsides. Call your physician if vomiting continues.  Special Instructions/Symptoms: Your throat may feel dry or sore from the anesthesia or the breathing tube placed in your throat during surgery. If this causes discomfort, gargle with warm salt water. The discomfort should disappear within 24 hours.  If you had a scopolamine patch placed behind your ear for the management of post- operative nausea and/or vomiting:  1. The medication in the patch is effective for 72 hours, after which it should be removed.  Wrap patch in a tissue and discard in the trash. Wash hands thoroughly with soap and water. 2. You may remove the patch earlier than 72 hours if you experience unpleasant side effects which may include dry mouth, dizziness or visual disturbances. 3. Avoid touching the patch. Wash your hands with soap and water after contact with the patch.    Colace ( over the counter) 100 mg by mouth twice  a day  Drink plenty of fluids ( prune juice may be helpful) and high fiber foods  Miralax ( over the counter) for constipation as needed.   Precautions: If you experience chest pain or shortness of breath - call 911 immediately For transfer to the hospital emergency department!!  If you develop a fever greater that 101 F, purulent drainage from wound, increased redness or drainage from wound, or calf pain -- Call the office   Follow- Up Appointment: Please call for an appointment to be seen in 1  week  [place] [facility] - [phone]

## 2016-04-23 NOTE — Interval H&P Note (Signed)
History and Physical Interval Note:  04/23/2016 11:48 AM  Alexander Hernandez  has presented today for surgery, with the diagnosis of CHONDROMALACIA PATELLAE RIGHT KNEE OTHER TEAR OF MEDIAL MENISCUS CURRENT INJURY UNSPECIFICIED KNEE INITIAL ENCOUNTER RIGHT KNEE SPRAIN OF UNSPECIFIED CRUCIATE LIGAMENT   The various methods of treatment have been discussed with the patient and family. After consideration of risks, benefits and other options for treatment, the patient has consented to  Procedure(s): RIGHT KNEE ARTHROSCOPY CHONDROPLASTY MEDIAL MENISCECTOMY, ANTERIOR CRUCIATE LIGAMENT (ACL) PATELLAR TENDON AUTOGRAFT (Right) as a surgical intervention .  The patient's history has been reviewed, patient examined, no change in status, stable for surgery.  I have reviewed the patient's chart and labs.  Questions were answered to the patient's satisfaction.     Ninetta Lights

## 2016-04-23 NOTE — Brief Op Note (Signed)
04/23/2016  1:31 PM  PATIENT:  Alexander Hernandez  40 y.o. 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 CURRENT INJURY UNSPECIFICIED KNEE INITIAL ENCOUNTER RIGHT KNEE SPRAIN OF UNSPECIFIED CRUCIATE LIGAMENT   PROCEDURE:  Procedure(s): RIGHT KNEE ARTHROSCOPY  ANTERIOR CRUCIATE LIGAMENT (ACL) PATELLAR TENDON AUTOGRAFT (Right) KNEE ARTHROSCOPY WITH LATERAL MENISCAL DEBRIDEMENT  SURGEON:  Surgeon(s) and Role:    * Ninetta Lights, MD - Primary  PHYSICIAN ASSISTANT:   ASSISTANTS: Joya Gaskins, OPA  ANESTHESIA:   regional and general  EBL:  Total I/O In: 1300 [I.V.:1300] Out: 73 [Blood:55]  BLOOD ADMINISTERED:none  DRAINS: none   LOCAL MEDICATIONS USED:  NONE  SPECIMEN:  No Specimen  DISPOSITION OF SPECIMEN:  N/A  COUNTS:  YES  TOURNIQUET:  * Missing tourniquet times found for documented tourniquets in log:  [id number] *  DICTATION: .Other Dictation: Dictation Number unknown  PLAN OF CARE: Discharge to home after PACU  PATIENT DISPOSITION:  PACU - hemodynamically stable.   Delay start of Pharmacological VTE agent (>24hrs) due to surgical blood loss or risk of bleeding: not applicable

## 2016-04-23 NOTE — Anesthesia Procedure Notes (Signed)
Anesthesia Regional Block:  Femoral nerve block  Pre-Anesthetic Checklist: ,, timeout performed, Correct Patient, Correct Site, Correct Laterality, Correct Procedure, Correct Position, site marked, Risks and benefits discussed,  Surgical consent,  Pre-op evaluation,  At surgeon's request and post-op pain management  Laterality: Right  Prep: chloraprep       Needles:  Injection technique: Single-shot  Needle Type: Stimiplex     Needle Length: 9cm 9 cm Needle Gauge: 21 and 21 G    Additional Needles:  Procedures: ultrasound guided (picture in chart) Femoral nerve block Narrative:  Start time: 04/23/2016 10:40 AM End time: 04/23/2016 10:43 AM Injection made incrementally with aspirations every 5 mL.  Performed by: Personally  Anesthesiologist: Nilda Simmer

## 2016-04-23 NOTE — Anesthesia Preprocedure Evaluation (Addendum)
Anesthesia Evaluation  Patient identified by MRN, date of birth, ID band Patient awake    Reviewed: Allergy & Precautions, NPO status , Patient's Chart, lab work & pertinent test results  History of Anesthesia Complications (+) PONV and history of anesthetic complications  Airway Mallampati: II  TM Distance: >3 FB Neck ROM: Full    Dental  (+) Teeth Intact, Dental Advisory Given   Pulmonary neg pulmonary ROS,    Pulmonary exam normal breath sounds clear to auscultation       Cardiovascular hypertension, (-) angina(-) Past MI, (-) Cardiac Stents, (-) CABG and (-) Orthopnea  Rhythm:Regular Rate:Normal     Neuro/Psych neg Seizures PSYCHIATRIC DISORDERS Anxiety Paresthesia of hands    GI/Hepatic Neg liver ROS, neg GERD  ,dysphagia   Endo/Other  prediabetes  Renal/GU negative Renal ROS     Musculoskeletal   Abdominal   Peds  Hematology negative hematology ROS (+)   Anesthesia Other Findings Allergic rhinitis  Reproductive/Obstetrics                          Anesthesia Physical Anesthesia Plan  ASA: II  Anesthesia Plan: General and Regional   Post-op Pain Management: GA combined w/ Regional for post-op pain   Induction: Intravenous  Airway Management Planned: LMA  Additional Equipment:   Intra-op Plan:   Post-operative Plan: Extubation in OR  Informed Consent: I have reviewed the patients History and Physical, chart, labs and discussed the procedure including the risks, benefits and alternatives for the proposed anesthesia with the patient or authorized representative who has indicated his/her understanding and acceptance.   Dental advisory given  Plan Discussed with: CRNA  Anesthesia Plan Comments: (Risks of general anesthesia discussed including, but not limited to, sore throat, hoarse voice, chipped/damaged teeth, injury to vocal cords, nausea and vomiting, allergic reactions,  lung infection, heart attack, stroke, and death. All questions answered.  Discussed potential risks of nerve blocks including, but not limited to, infection, bleeding, nerve damage, seizures, pneumothorax, respiratory depression, and potential failure of the block. Alternatives to nerve blocks discussed. All questions answered. )      Anesthesia Quick Evaluation

## 2016-04-24 NOTE — Op Note (Signed)
NAME:  Alexander Hernandez, ADGER NO.:  [id number]  MEDICAL RECORD NO.:  [id number]  LOCATION:                                 FACILITY:  PHYSICIAN:  Ninetta Lights, M.D. DATE OF BIRTH:  03/26/1976  DATE OF PROCEDURE:  04/23/2016 DATE OF DISCHARGE:                              OPERATIVE REPORT   PREOPERATIVE DIAGNOSIS: 1. 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 2. Question recurrent medial meniscus tear.  POSTOPERATIVE DIAGNOSES: 1. Right knee completely stretched out nonfunctional anterior cruciate     ligament with anterolateral rotary instability. 2. Previous partial medial meniscectomy without recurrent tears. 3. Small flap tear of the posterior horn of lateral meniscus and     middle third. 4. Some grade 2 scuffing chondral injury, lateral tibial plateau and     medial femoral condyle.  PROCEDURES: 1. Right knee exam under anesthesia, arthroscopy. 2. Arthroscopic endoscopic ACL reconstruction, middle third patellar     tendon autograft, bone tendon bone. 3. Notchplasty. 4. Bioabsorbable screw fixation. 5. Debridement of lateral meniscus. 6. Chondroplasty of lateral tibial plateau and medial femoral condyle.  SURGEON:  Ninetta Lights, M.D.  ASSISTANT:  Angelena Sole, OPA, present throughout the entire case and necessary for timely completion of procedure.  ANESTHESIA:  General.  BLOOD LOSS:  Minimal.  SPECIMENS:  None.  CULTURES:  None.  COMPLICATIONS:  None.  DRESSINGS:  Sterile compressive, knee immobilizer.  TOURNIQUET TIME:  1 hour 5 minutes.  DESCRIPTION OF PROCEDURE:  The patient was brought to the operating room and after adequate anesthesia had been obtained, tourniquet applied, prepped and draped in the usual sterile fashion.  Exsanguinated with elevation of Esmarch.  Tourniquet inflated to 350 mmHg.  Both knees have been examined.  Definitely more ACL instability on the right compared to the left.   Two portals; one each medial and lateral parapatellar. Arthroscope introduced, knee distended and inspected.  Good patellar tracking and cartilage.  What was left to the ACL was still present, but this was markedly stretched out, attenuated, completely nonfunctional. Debrided.  Re-examining, and after debridement, his exam was completely unchanged confirming that the ligament was providing no stability.  Some frayed tearing of the back lateral meniscus, middle third, both debrided, tapered it smoothly.  Not removing a lot of cartilage fortunately.  Some grade 2 scuffing, lateral tibial plateau and medial femoral condyle; debrided.  Medial meniscus was inspected, no further tears.  Instruments fully removed.  Longitudinal incision, patella to tibial tubercle.  Middle third patellar tendon was harvested for 9 mm tunnels.  This was then prepared.  Defect closed with Vicryl. Arthroscope re-placed in the knee.  Small incision next to the tibial tubercle.  Guidewire, then overdrilled with a 9 mm reamer exiting out through the footprint of the ACL on the tibia.  Femoral guide was inserted across tibial tunnel notch on the back cortex of the femur. Guidewire and then reamed for appropriate depth of the 9 mm reamer. Debris cleared throughout.  Tunnels found to be in good position.  Two- pin passer inserted across both tunnels and out through a stab wound, anterolateral thigh.  Nitinol wire  brought to the medial portal and allowed through the femoral tunnel.  Graft attached to the two-pin passer, pulled it across the knee, seating it well in the femoral tunnel.  Fixed with an 8 x 25 BioScrew over a nitinol wire.  Good capture and fixation.  Graft was then tensioned at 70 degrees, and the tibial peg was fixed in the tibial tunnel with a 9 x 25 screw.  At completion, nice, solid, and stable fixation.  No impingement through full motion.  Great stability.  All nitinol wires had been removed. Wounds  were irrigated and closed subcutaneous in a subcuticular manner. Portals were closed with nylon.  Sterile compressive dressing applied. Tourniquet deflated, removed.  Knee immobilizer applied.  Anesthesia reversed.  Brought to the recovery room.  Tolerated the surgery well. No complications.     Ninetta Lights, M.D.   ______________________________ Ninetta Lights, M.D.    DFM/MEDQ  D:  04/23/2016  T:  04/24/2016  Job:  [id number]

## 2016-04-26 ENCOUNTER — Encounter (HOSPITAL_BASED_OUTPATIENT_CLINIC_OR_DEPARTMENT_OTHER): Payer: Self-pay | Admitting: Orthopedic Surgery

## 2016-06-02 ENCOUNTER — Ambulatory Visit (INDEPENDENT_AMBULATORY_CARE_PROVIDER_SITE_OTHER): Payer: 59 | Admitting: Family Medicine

## 2016-06-02 ENCOUNTER — Encounter: Payer: Self-pay | Admitting: Family Medicine

## 2016-06-02 VITALS — BP 104/70 | HR 78 | Temp 98.1°F | Resp 16 | Ht 69.0 in | Wt 192.0 lb

## 2016-06-02 DIAGNOSIS — J3089 Other allergic rhinitis: Secondary | ICD-10-CM

## 2016-06-02 DIAGNOSIS — J4540 Moderate persistent asthma, uncomplicated: Secondary | ICD-10-CM | POA: Diagnosis not present

## 2016-06-02 DIAGNOSIS — J302 Other seasonal allergic rhinitis: Secondary | ICD-10-CM | POA: Diagnosis not present

## 2016-06-02 DIAGNOSIS — J45909 Unspecified asthma, uncomplicated: Secondary | ICD-10-CM | POA: Insufficient documentation

## 2016-06-02 MED ORDER — BECLOMETHASONE DIPROPIONATE 80 MCG/ACT IN AERS: 1.0000 | INHALATION_SPRAY | Freq: Two times a day (BID) | RESPIRATORY_TRACT | 3 refills | Status: DC

## 2016-06-02 MED ORDER — ALBUTEROL SULFATE HFA 108 (90 BASE) MCG/ACT IN AERS: 2.0000 | INHALATION_SPRAY | Freq: Four times a day (QID) | RESPIRATORY_TRACT | 2 refills | Status: DC | PRN

## 2016-06-02 NOTE — Patient Instructions (Signed)
Follow up as needed Start the Qvar- 1 puff twice daily- every day Use the Albuterol as needed for wheezing or shortness of breath We'll call you with your allergy appt Continue the Zyrtec, Flonase, and Singulair daily Call with any questions or concerns Hang in there!!!

## 2016-06-02 NOTE — Assessment & Plan Note (Signed)
New.  Pt is waking up at night wheezing and short of breath- which is concerning for nocturnal asthma sxs.  He reports sxs improve w/ benadryl which lead to the conclusion that this is allergy mediated.  Pt admits that he has both birds and cats in the house (which he knows he is allergic to) and that he does not have similar sxs when he is out of town.  Stressed need for daily use of Zyrtec, Singulair, and Flonase and trigger avoidance.  Start Qvar 1 puff BID and albuterol prn.  Refer to allergy and asthma for a complete evaluation.  Pt expressed understanding and is in agreement w/ plan.

## 2016-06-02 NOTE — Progress Notes (Signed)
Pre visit review using our clinic review tool, if applicable. No additional management support is needed unless otherwise documented below in the visit note. 

## 2016-06-02 NOTE — Assessment & Plan Note (Signed)
See above.  Stressed that pt needs to avoid his known triggers and until he does so, the medication will not likely be effective.  Will follow.

## 2016-06-02 NOTE — Progress Notes (Signed)
   Subjective:    Patient ID: Alexander Hernandez, male    DOB: 06-04-1976, 40 y.o.   MRN: [id number]  HPI Allergies- pt reports he has wheezing at night and chest congestion.  Pt did have a shower leak and possible mold exposure.  Taking Zyrtec D, Singulair, and Flonase 'as needed'.  Still continues to struggle w/ sneezing.  Pt has cockatoos, 2 cats in the house.  sxs have been worsening x6 months.   Review of Systems For ROS see HPI     Objective:   Physical Exam  Constitutional: He is oriented to person, place, and time. He appears well-developed and well-nourished. No distress.  HENT:  Head: Normocephalic and atraumatic.  No TTP over sinuses + turbinate edema + PND TMs normal bilaterally  Eyes: Conjunctivae and EOM are normal. Pupils are equal, round, and reactive to light.  Neck: Normal range of motion. Neck supple.  Cardiovascular: Normal rate, regular rhythm and normal heart sounds.   Pulmonary/Chest: Effort normal and breath sounds normal. No respiratory distress. He has no wheezes.  No cough or wheeze heard today  Lymphadenopathy:    He has no cervical adenopathy.  Neurological: He is alert and oriented to person, place, and time.  Skin: Skin is warm and dry.  Psychiatric: He has a normal mood and affect. His behavior is normal. Thought content normal.  Vitals reviewed.         Assessment & Plan:

## 2016-07-01 ENCOUNTER — Encounter: Payer: Self-pay | Admitting: Allergy

## 2016-07-01 ENCOUNTER — Ambulatory Visit (INDEPENDENT_AMBULATORY_CARE_PROVIDER_SITE_OTHER): Payer: 59 | Admitting: Allergy

## 2016-07-01 VITALS — BP 122/82 | HR 85 | Temp 98.5°F | Ht 69.0 in | Wt 198.8 lb

## 2016-07-01 DIAGNOSIS — J309 Allergic rhinitis, unspecified: Secondary | ICD-10-CM | POA: Diagnosis not present

## 2016-07-01 DIAGNOSIS — J4540 Moderate persistent asthma, uncomplicated: Secondary | ICD-10-CM

## 2016-07-01 DIAGNOSIS — H1010 Acute atopic conjunctivitis, unspecified eye: Secondary | ICD-10-CM

## 2016-07-01 MED ORDER — AZELASTINE HCL 0.1 % NA SOLN: 2.0000 | Freq: Two times a day (BID) | NASAL | 5 refills | Status: DC

## 2016-07-01 MED ORDER — BECLOMETHASONE DIPROPIONATE 80 MCG/ACT IN AERS: 2.0000 | INHALATION_SPRAY | Freq: Two times a day (BID) | RESPIRATORY_TRACT | 5 refills | Status: DC

## 2016-07-01 MED ORDER — OLOPATADINE HCL 0.7 % OP SOLN: 1.0000 [drp] | Freq: Every day | OPHTHALMIC | 5 refills | Status: DC | PRN

## 2016-07-01 NOTE — Patient Instructions (Addendum)
Allergy testing today was positive for variety of trees, weeds, molds, dust mites, cat, dog.    Resume your Xyzal 10mg , Singulair 10mg  daily  Use Saline rinses prior to use of nasal sprays  Use Flonase 2 sprays each nostril with proper technique daily  Will add on Astelin nasal spray 2 sprays each nostril twice a day  Use Pazeo 1 drop each eye as needed for itchy/watery/red eyes  Continue Qvar increase to 2 puffs twice a day and use with spacer.  Use albuterol 2 puffs every 4 hours as needed for cough/wheeze/shortness of breath  Asthma control goals:   Full participation in all desired activities (may need albuterol before activity)  Albuterol use two time or less a week on average (not counting use with activity)  Cough interfering with sleep two time or less a month  Oral steroids no more than once a year  No hospitalizations  Wear protective nose/mouth covering when dealing with dust and other products that can be airborne.   Follow-up 4 months
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
## 2016-07-07 ENCOUNTER — Other Ambulatory Visit: Payer: Self-pay | Admitting: *Deleted

## 2016-07-07 MED ORDER — OLOPATADINE HCL 0.1 % OP SOLN: 1.0000 [drp] | Freq: Two times a day (BID) | OPHTHALMIC | 5 refills | Status: DC

## 2016-08-03 ENCOUNTER — Emergency Department (HOSPITAL_BASED_OUTPATIENT_CLINIC_OR_DEPARTMENT_OTHER)
Admission: EM | Admit: 2016-08-03 | Discharge: 2016-08-03 | Disposition: A | Discharge status: Home / Self Care | Payer: 59 | Attending: Emergency Medicine | Admitting: Emergency Medicine

## 2016-08-03 ENCOUNTER — Encounter (HOSPITAL_BASED_OUTPATIENT_CLINIC_OR_DEPARTMENT_OTHER): Payer: Self-pay | Admitting: Emergency Medicine

## 2016-08-03 DIAGNOSIS — R220 Localized swelling, mass and lump, head: Secondary | ICD-10-CM | POA: Diagnosis present

## 2016-08-03 DIAGNOSIS — Z79899 Other long term (current) drug therapy: Secondary | ICD-10-CM | POA: Insufficient documentation

## 2016-08-03 DIAGNOSIS — L03211 Cellulitis of face: Secondary | ICD-10-CM | POA: Insufficient documentation

## 2016-08-03 DIAGNOSIS — I10 Essential (primary) hypertension: Secondary | ICD-10-CM | POA: Diagnosis not present

## 2016-08-03 MED ORDER — DOXYCYCLINE HYCLATE 100 MG PO CAPS: 100.0000 mg | ORAL_CAPSULE | Freq: Two times a day (BID) | ORAL | 0 refills | Status: DC

## 2016-08-03 MED ORDER — DOXYCYCLINE HYCLATE 100 MG PO TABS
100.0000 mg | ORAL_TABLET | Freq: Once | ORAL | Status: AC
  Administered 2016-08-03: 100 mg via ORAL
  Filled 2016-08-03: qty 1

## 2016-08-03 NOTE — ED Triage Notes (Signed)
Swelling to right jaw x3 days.  Pt sts he had a severe case of poison ivy and this one area has swollen and is hot and painful.
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
## 2016-09-05 ENCOUNTER — Other Ambulatory Visit: Payer: Self-pay | Admitting: Family Medicine

## 2016-09-05 DIAGNOSIS — L0232 Furuncle of buttock: Secondary | ICD-10-CM | POA: Diagnosis not present

## 2016-09-05 DIAGNOSIS — L03317 Cellulitis of buttock: Secondary | ICD-10-CM | POA: Diagnosis not present

## 2016-09-07 ENCOUNTER — Encounter: Payer: Self-pay | Admitting: Physician Assistant

## 2016-09-07 ENCOUNTER — Ambulatory Visit (INDEPENDENT_AMBULATORY_CARE_PROVIDER_SITE_OTHER): Payer: 59 | Admitting: Physician Assistant

## 2016-09-07 VITALS — BP 126/80 | HR 96 | Temp 98.4°F | Resp 16 | Ht 69.0 in | Wt 194.0 lb

## 2016-09-07 DIAGNOSIS — R739 Hyperglycemia, unspecified: Secondary | ICD-10-CM | POA: Diagnosis not present

## 2016-09-07 DIAGNOSIS — L0231 Cutaneous abscess of buttock: Secondary | ICD-10-CM

## 2016-09-07 DIAGNOSIS — L03317 Cellulitis of buttock: Secondary | ICD-10-CM

## 2016-09-07 LAB — HEMOGLOBIN A1C: HEMOGLOBIN A1C: 5.5 % (ref 4.6–6.5)

## 2016-09-07 LAB — GLUCOSE, POCT (MANUAL RESULT ENTRY): POC GLUCOSE: 107 mg/dL — AB (ref 70–99)

## 2016-09-07 MED ORDER — MELOXICAM 7.5 MG PO TABS: 7.5000 mg | ORAL_TABLET | Freq: Every day | ORAL | 0 refills | Status: DC

## 2016-09-07 MED ORDER — DOXYCYCLINE HYCLATE 100 MG PO CAPS: 100.0000 mg | ORAL_CAPSULE | Freq: Two times a day (BID) | ORAL | 0 refills | Status: DC

## 2016-09-07 NOTE — Progress Notes (Signed)
Pre visit review using our clinic review tool, if applicable. No additional management support is needed unless otherwise documented below in the visit note. 

## 2016-09-07 NOTE — Progress Notes (Signed)
Patient presents to clinic today c/o worsening abscess/ceullitis of left medial gluteal region. Notes 4 days of symptoms. Went to [facility] in [place]) on day of symptom onset. Endorses having drained in office with culture sent. Was started on Bactrim twice daily. Has been taking as directed. Notes worsening size and redness of the area with antibiotic. Denies drainage. Denies fever. Is concerned about diabetes giving hx borderline levels and this infection. Denies polyuria, polydipsia or polyphagia.   Past Medical History:  Diagnosis Date  . Borderline diabetes   . Enlarged prostate   . Perennial allergic rhinitis   . PONV (postoperative nausea and vomiting)   . Pre-diabetes   . Tick bite 4-10    Current Outpatient Prescriptions on File Prior to Visit  Medication Sig Dispense Refill  . albuterol (PROVENTIL HFA;VENTOLIN HFA) 108 (90 Base) MCG/ACT inhaler Inhale 2 puffs into the lungs every 6 (six) hours as needed for wheezing or shortness of breath. 1 Inhaler 2  . azelastine (ASTELIN) 0.1 % nasal spray Place 2 sprays into both nostrils 2 (two) times daily. Use in each nostril as directed 30 mL 5  . beclomethasone (QVAR) 80 MCG/ACT inhaler Inhale 2 puffs into the lungs 2 (two) times daily. 1 Inhaler 5  . cetirizine-pseudoephedrine (ZYRTEC-D) 5-120 MG tablet 1 twice daily as needed 60 tablet 5  . fluticasone (FLONASE) 50 MCG/ACT nasal spray Place 2 sprays into both nostrils at bedtime. 16 g prn  . montelukast (SINGULAIR) 10 MG tablet TAKE ONE TABLET BY MOUTH AT BEDTIME 30 tablet 6  . olopatadine (PATANOL) 0.1 % ophthalmic solution Place 1 drop into both eyes 2 (two) times daily. 5 mL 5  . tamsulosin (FLOMAX) 0.4 MG CAPS capsule Take 0.4 mg by mouth.     No current facility-administered medications on file prior to visit.     Allergies  Allergen Reactions  . Codeine Other (See Comments)    REACTION: skin irritation, pains, tenderness to touch  . Other Other (See Comments)   Narcotics-prefers not to take meds    Family History  Problem Relation Age of Onset  . Diabetes Mother   . Hypertension Mother   . Hypertension Father   . Prostate cancer Paternal Grandfather   . Dementia Paternal Uncle   . Dementia Paternal Grandmother   . Asthma Neg Hx   . Allergic rhinitis Neg Hx     Social History   Social History  . Marital status: Married    Spouse name: N/A  . Number of children: 1  . Years of education: N/A   Occupational History  .    . fireman   . owns [employer] History Main Topics  . Smoking status: Never Smoker  . Smokeless tobacco: Never Used  . Alcohol use 0.0 oz/week     Comment: occasional  . Drug use: No  . Sexual activity: Not Asked   Other Topics Concern  . None   Social History Narrative   separated from wife   Lives alone   Review of Systems - See HPI.  All other ROS are negative.  BP 126/80   Pulse 96   Temp 98.4 F (36.9 C) (Oral)   Resp 16   Ht 5\' 9"  (1.753 m)   Wt 194 lb (88 kg)   SpO2 95%   BMI 28.65 kg/m   Physical Exam  Constitutional: He is oriented to person, place, and time and well-developed, well-nourished, and in no distress.  HENT:  Head: Normocephalic and atraumatic.  Eyes: Conjunctivae are normal.  Cardiovascular: Normal rate, regular rhythm, normal heart sounds and intact distal pulses.   Pulmonary/Chest: Effort normal and breath sounds normal.  Neurological: He is alert and oriented to person, place, and time.  Skin: Skin is warm and dry.     Psychiatric: Affect normal.  Vitals reviewed.  Assessment/Plan: 1. Cellulitis and abscess of buttock No fluctuance noted for simple in-office I/D. Spoke with Urgent Care. Culture was just sent out. Will not result for a couple of days. Will stop Bactrim and begin Doxycycline. Pain medication discussed. Urgent Referral to General Surgery placed. Strict ER precautions given to patient.  - Ambulatory referral to General Surgery  2.  Hyperglycemia Non-fasting POCT glucose at 107. Will repeat A1C due to patient's concerns. - Hemoglobin A1c - POCT Glucose (CBG)   Leeanne Rio, PA-C

## 2016-09-07 NOTE — Patient Instructions (Signed)
Please stop the Bactrim and start the Doxycycline as directed.   Will check labs today to assess for diabetes giving recurrent infection. If your culture comes back positive for MRSA from Urgent Care, would recommend we start eradication therapy for MRSA colonization.  You will be contacted by General Surgery for appointment. Please stop by front desk and speak to [place] regarding this.  If you note any worsening symptoms while on new antibiotic, or you note fever, please go to the ER.

## 2016-09-09 DIAGNOSIS — L0231 Cutaneous abscess of buttock: Secondary | ICD-10-CM | POA: Diagnosis not present

## 2016-10-29 ENCOUNTER — Ambulatory Visit: Payer: 59 | Admitting: Allergy

## 2016-10-29 ENCOUNTER — Other Ambulatory Visit: Payer: Self-pay | Admitting: Family Medicine

## 2016-10-29 DIAGNOSIS — L03314 Cellulitis of groin: Secondary | ICD-10-CM | POA: Diagnosis not present

## 2016-10-29 DIAGNOSIS — L0231 Cutaneous abscess of buttock: Secondary | ICD-10-CM | POA: Diagnosis not present

## 2016-10-30 DIAGNOSIS — M25552 Pain in left hip: Secondary | ICD-10-CM | POA: Diagnosis not present

## 2016-11-02 ENCOUNTER — Telehealth: Payer: Self-pay | Admitting: Family Medicine

## 2016-11-02 NOTE — Telephone Encounter (Signed)
Patient declined appt, requesting a call back from Dr. Birdie Riddle or her nurse.

## 2016-11-02 NOTE — Telephone Encounter (Signed)
Unfortunately per PCP pt needs an appointment for evaluation.

## 2016-11-02 NOTE — Telephone Encounter (Signed)
Could you please call pt and find out what he is needing, PCP recommendation was an office visit.

## 2016-11-02 NOTE — Telephone Encounter (Addendum)
SW patient regarding symptoms. Patient states he went to a local urgent care this weekend to have a boil drained. Patient was prescribed Bactrim, would like to know if this treatment is aggressive enough to prevent MRSA from infecting hip implant. Patient requesting stronger/different antibiotic if necessary, willing to be evaluated tomorrow if absolutely necessary.

## 2016-11-02 NOTE — Telephone Encounter (Signed)
Bactrim is appropriate to cover MRSA and I agree w/ this treatment.  No need for another appointment unless area is not improving or worsening

## 2016-11-02 NOTE — Telephone Encounter (Signed)
Called patient to discuss symptoms, no answer, voicemail not set up. Will continue to attempt to contact patient.

## 2016-11-02 NOTE — Telephone Encounter (Signed)
Patient called earlier and was transferred to [facility], however he did not provide any information to nurse and is now calling back.  He reports he has had boils since his last surgery.  He was seen at an urgent care facility and diagnosed with MRSA.  He is currently taking Bactrim but feels he needs to be taking something else to prevent infection from spreading to his hip implant.    Patient states he has contacted his ortho doctor but he is in surgery today and the office is unable to help him.  So he is reaching out to pcp for assistance.  Patient requesting a call back at [phone].

## 2016-11-02 NOTE — Telephone Encounter (Signed)
Patient notified, verbalized understanding

## 2016-11-04 ENCOUNTER — Ambulatory Visit: Payer: 59 | Admitting: Allergy

## 2016-11-05 ENCOUNTER — Ambulatory Visit: Payer: 59 | Admitting: Allergy

## 2016-12-28 ENCOUNTER — Ambulatory Visit
Admission: RE | Admit: 2016-12-28 | Discharge: 2016-12-28 | Disposition: A | Discharge status: Home / Self Care | Payer: No Typology Code available for payment source | Source: Ambulatory Visit | Attending: Occupational Medicine | Admitting: Occupational Medicine

## 2016-12-28 ENCOUNTER — Other Ambulatory Visit: Payer: Self-pay | Admitting: Occupational Medicine

## 2016-12-28 DIAGNOSIS — Z0000 Encounter for general adult medical examination without abnormal findings: Secondary | ICD-10-CM

## 2016-12-28 IMAGING — CR DG CHEST 2V
2 series · 2 of 2 positions shown · non-contrast
Comparison: Chest x-ray of December 30, 2009

CLINICAL DATA: Onset of shortness of breath yesterday

EXAM:
CHEST  2 VIEW

[chest pa]
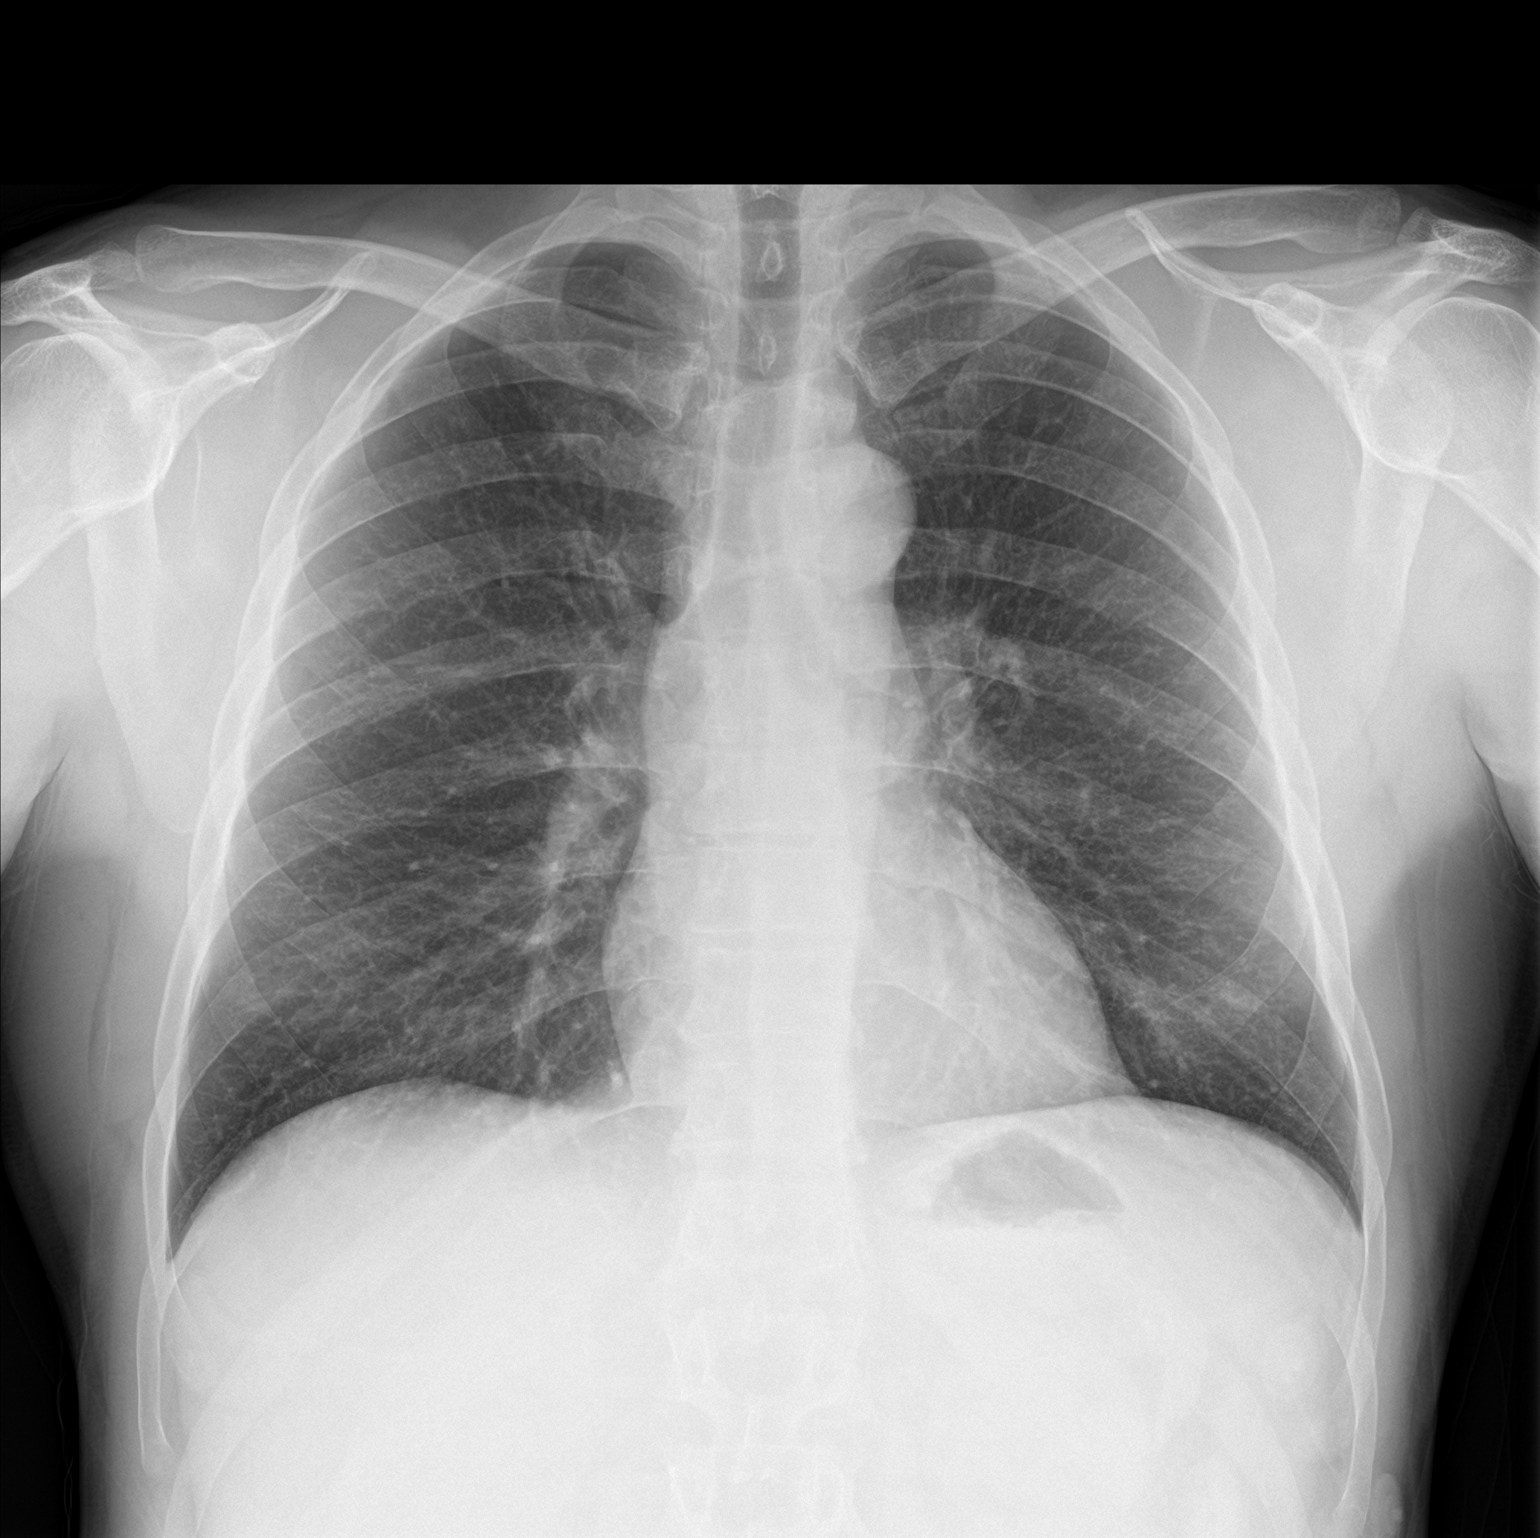

[chest lat]
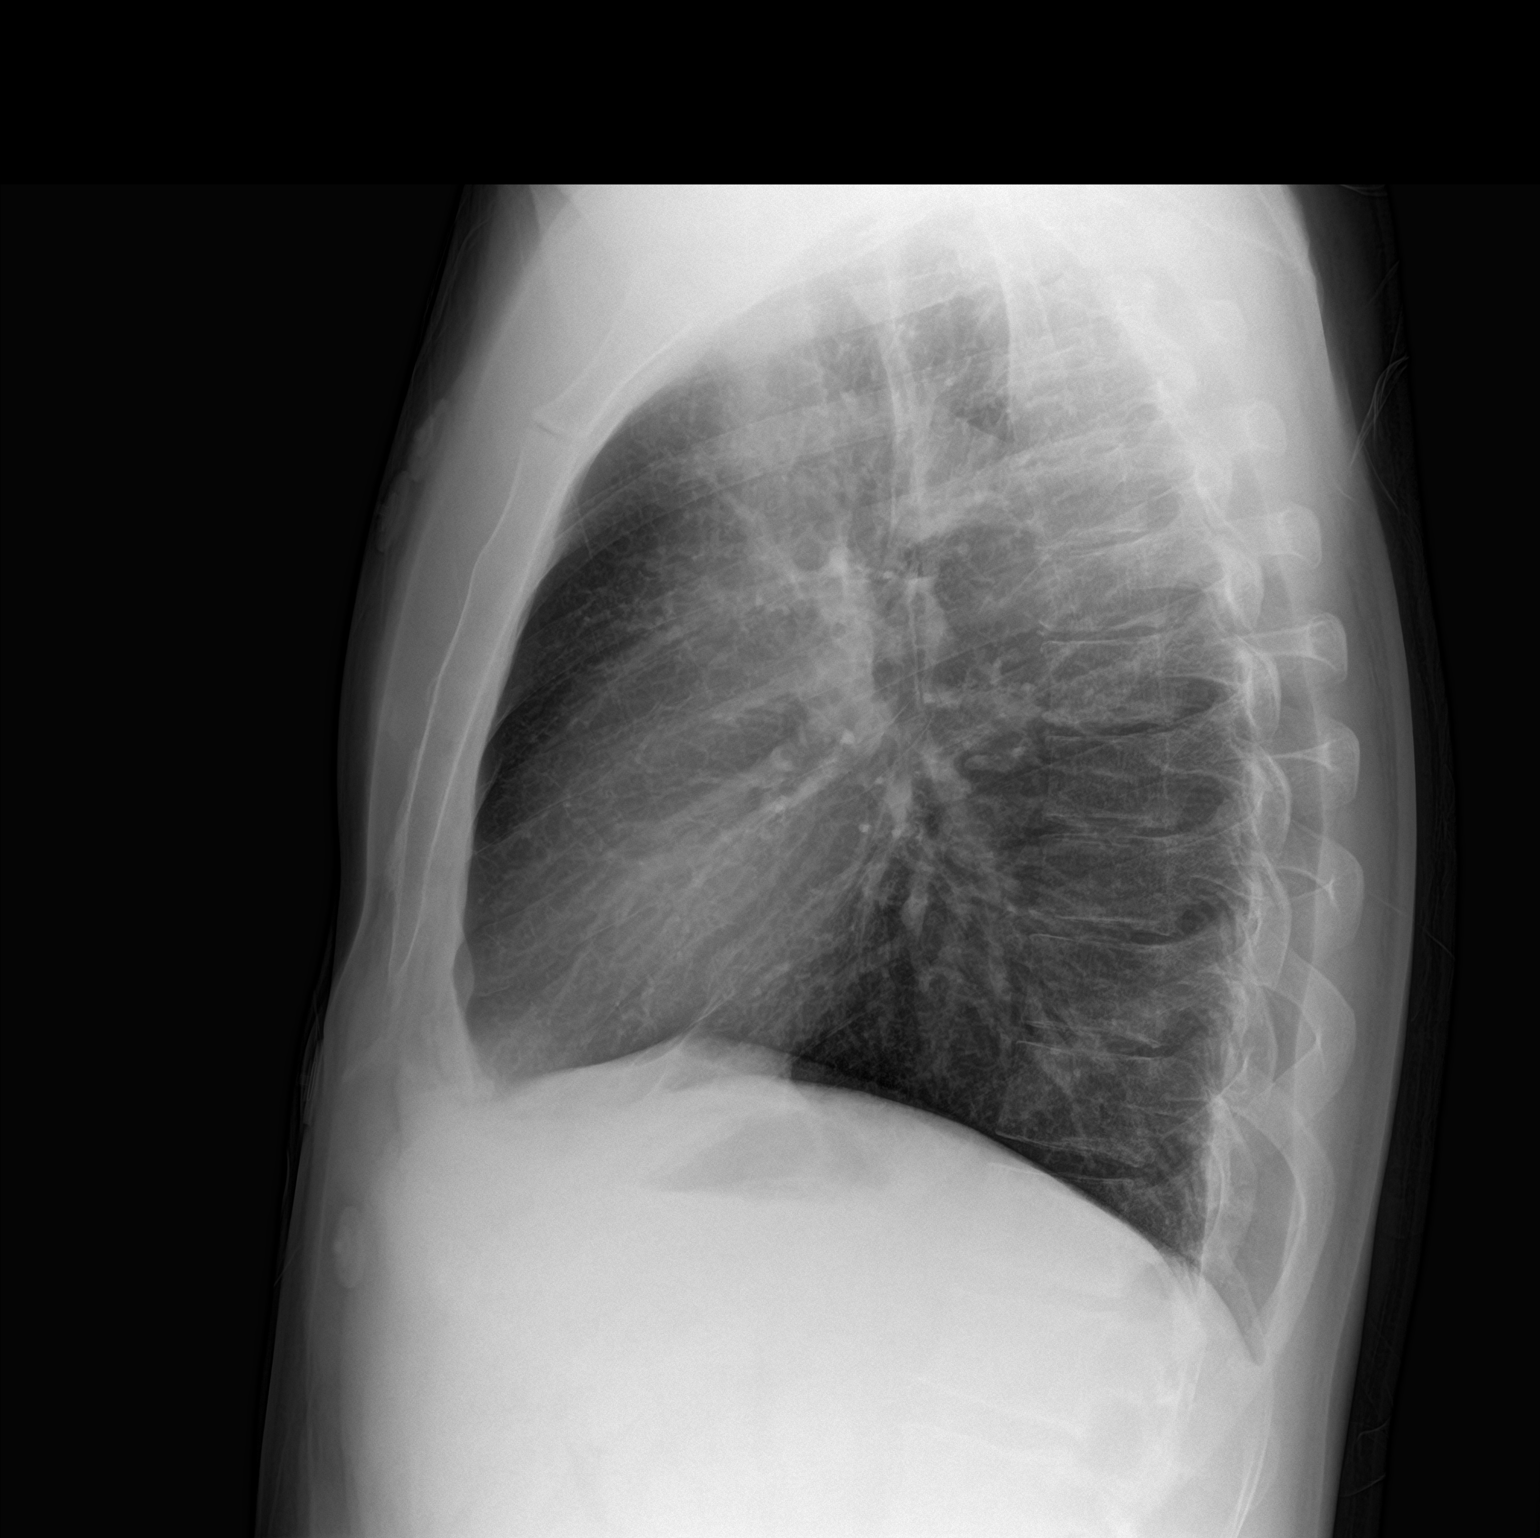

[2 of 2 positions shown; findings below may reference images not displayed]

FINDINGS: The lungs are adequately inflated and clear. There is stable
scarring in the left lower lobe. The heart and pulmonary vascularity
are normal. The mediastinum is normal in width. There is no pleural
effusion. The bony thorax is unremarkable.
IMPRESSION: There is no active cardiopulmonary disease.

## 2017-06-16 ENCOUNTER — Encounter: Payer: Self-pay | Admitting: Physician Assistant

## 2017-06-16 ENCOUNTER — Other Ambulatory Visit: Payer: Self-pay

## 2017-06-16 ENCOUNTER — Ambulatory Visit: Payer: Self-pay | Admitting: Physician Assistant

## 2017-06-16 VITALS — BP 120/86 | HR 75 | Temp 98.5°F | Resp 14 | Ht 69.0 in | Wt 198.0 lb

## 2017-06-16 DIAGNOSIS — H6983 Other specified disorders of Eustachian tube, bilateral: Secondary | ICD-10-CM | POA: Diagnosis not present

## 2017-06-16 DIAGNOSIS — S134XXA Sprain of ligaments of cervical spine, initial encounter: Secondary | ICD-10-CM

## 2017-06-16 DIAGNOSIS — M546 Pain in thoracic spine: Secondary | ICD-10-CM

## 2017-06-16 DIAGNOSIS — H6993 Unspecified Eustachian tube disorder, bilateral: Secondary | ICD-10-CM

## 2017-06-16 MED ORDER — MELOXICAM 15 MG PO TABS: 15.0000 mg | ORAL_TABLET | Freq: Every day | ORAL | 0 refills | Status: DC

## 2017-06-16 MED ORDER — CYCLOBENZAPRINE HCL 10 MG PO TABS: 10.0000 mg | ORAL_TABLET | Freq: Every day | ORAL | 0 refills | Status: DC

## 2017-06-16 MED ORDER — CETIRIZINE-PSEUDOEPHEDRINE ER 5-120 MG PO TB12: 1.0000 | ORAL_TABLET | Freq: Two times a day (BID) | ORAL | 1 refills | Status: DC

## 2017-06-16 MED ORDER — FLUTICASONE PROPIONATE 50 MCG/ACT NA SUSP: 2.0000 | Freq: Every day | NASAL | 99 refills | Status: DC

## 2017-06-16 NOTE — Progress Notes (Signed)
Pre visit review using our clinic review tool, if applicable. No additional management support is needed unless otherwise documented below in the visit note. 
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
## 2017-06-29 ENCOUNTER — Other Ambulatory Visit: Payer: Self-pay

## 2017-06-29 ENCOUNTER — Encounter: Payer: Self-pay | Admitting: Family Medicine

## 2017-06-29 ENCOUNTER — Ambulatory Visit (INDEPENDENT_AMBULATORY_CARE_PROVIDER_SITE_OTHER): Payer: Self-pay | Admitting: Family Medicine

## 2017-06-29 VITALS — BP 122/82 | HR 92 | Temp 98.2°F | Resp 16 | Ht 69.0 in | Wt 197.1 lb

## 2017-06-29 DIAGNOSIS — Z23 Encounter for immunization: Secondary | ICD-10-CM

## 2017-06-29 DIAGNOSIS — M62838 Other muscle spasm: Secondary | ICD-10-CM

## 2017-06-29 DIAGNOSIS — J309 Allergic rhinitis, unspecified: Secondary | ICD-10-CM

## 2017-06-29 MED ORDER — MONTELUKAST SODIUM 10 MG PO TABS: 10.0000 mg | ORAL_TABLET | Freq: Every day | ORAL | 1 refills | Status: DC

## 2017-06-29 MED ORDER — ALBUTEROL SULFATE HFA 108 (90 BASE) MCG/ACT IN AERS
2.0000 | INHALATION_SPRAY | Freq: Four times a day (QID) | RESPIRATORY_TRACT | 2 refills | Status: DC | PRN
Start: 2017-06-29 — End: 2018-10-13

## 2017-06-29 NOTE — Patient Instructions (Signed)
Follow up in 2-3 weeks to recheck trap spasm Continue Flexeril at night HEAT!!! Massage as you are able Restart the Singulair nightly Add OTC Phenylephrine as a decongestant Drink plenty of fluids Use the Albuterol as needed for wheezing Call with any questions or concerns Hang in there!!!
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
## 2017-07-12 ENCOUNTER — Ambulatory Visit: Payer: Self-pay | Admitting: Family Medicine

## 2017-07-13 ENCOUNTER — Emergency Department (HOSPITAL_BASED_OUTPATIENT_CLINIC_OR_DEPARTMENT_OTHER): Payer: 59

## 2017-07-13 ENCOUNTER — Encounter (HOSPITAL_BASED_OUTPATIENT_CLINIC_OR_DEPARTMENT_OTHER): Payer: Self-pay | Admitting: Emergency Medicine

## 2017-07-13 ENCOUNTER — Emergency Department (HOSPITAL_BASED_OUTPATIENT_CLINIC_OR_DEPARTMENT_OTHER)
Admission: EM | Admit: 2017-07-13 | Discharge: 2017-07-14 | Disposition: A | Discharge status: Home / Self Care | Payer: 59 | Attending: Emergency Medicine | Admitting: Emergency Medicine

## 2017-07-13 ENCOUNTER — Other Ambulatory Visit: Payer: Self-pay

## 2017-07-13 DIAGNOSIS — R059 Cough, unspecified: Secondary | ICD-10-CM

## 2017-07-13 DIAGNOSIS — J9801 Acute bronchospasm: Secondary | ICD-10-CM | POA: Insufficient documentation

## 2017-07-13 DIAGNOSIS — Z79899 Other long term (current) drug therapy: Secondary | ICD-10-CM | POA: Diagnosis not present

## 2017-07-13 DIAGNOSIS — R0602 Shortness of breath: Secondary | ICD-10-CM | POA: Diagnosis not present

## 2017-07-13 DIAGNOSIS — R05 Cough: Secondary | ICD-10-CM | POA: Diagnosis not present

## 2017-07-13 DIAGNOSIS — I10 Essential (primary) hypertension: Secondary | ICD-10-CM | POA: Insufficient documentation

## 2017-07-13 DIAGNOSIS — R0600 Dyspnea, unspecified: Secondary | ICD-10-CM

## 2017-07-13 MED ORDER — ALBUTEROL SULFATE HFA 108 (90 BASE) MCG/ACT IN AERS: 1.0000 | INHALATION_SPRAY | Freq: Four times a day (QID) | RESPIRATORY_TRACT | 0 refills | Status: DC | PRN

## 2017-07-13 MED ORDER — ALBUTEROL SULFATE HFA 108 (90 BASE) MCG/ACT IN AERS: 2.0000 | INHALATION_SPRAY | Freq: Once | RESPIRATORY_TRACT | Status: DC

## 2017-07-13 MED ORDER — PANTOPRAZOLE SODIUM 40 MG PO TBEC
40.0000 mg | DELAYED_RELEASE_TABLET | Freq: Once | ORAL | Status: AC
  Administered 2017-07-13: 40 mg via ORAL
  Filled 2017-07-13: qty 1

## 2017-07-13 MED ORDER — IPRATROPIUM-ALBUTEROL 0.5-2.5 (3) MG/3ML IN SOLN
3.0000 mL | Freq: Four times a day (QID) | RESPIRATORY_TRACT | Status: DC
  Administered 2017-07-13: 3 mL via RESPIRATORY_TRACT
  Filled 2017-07-13: qty 3

## 2017-07-13 MED ORDER — PANTOPRAZOLE SODIUM 20 MG PO TBEC
20.0000 mg | DELAYED_RELEASE_TABLET | Freq: Once | ORAL | Status: DC
  Filled 2017-07-13: qty 1

## 2017-07-13 MED ORDER — AEROCHAMBER PLUS W/MASK MISC: 2 refills | Status: DC

## 2017-07-13 MED ORDER — PANTOPRAZOLE SODIUM 20 MG PO TBEC: 20.0000 mg | DELAYED_RELEASE_TABLET | Freq: Every day | ORAL | 0 refills | Status: DC

## 2017-07-13 NOTE — ED Triage Notes (Signed)
Patient states that he has had increased SOB  - he started to have a chest cold about 2 weeks ago. 

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
## 2017-07-13 NOTE — ED Triage Notes (Signed)
While triaging the patient he states that early he has "tachycardia" when asked how how he states that he measured his Carotid artery but was unable ot give a number for his heart rate. Patient states that he knows that it was beating fast and "not right, too fast" - patient states that he feels worse at night, and that he tries the inhaler at night but it does not help

## 2017-07-15 ENCOUNTER — Other Ambulatory Visit: Payer: Self-pay

## 2017-07-15 ENCOUNTER — Encounter: Payer: Self-pay | Admitting: Family Medicine

## 2017-07-15 ENCOUNTER — Ambulatory Visit: Payer: 59 | Admitting: Family Medicine

## 2017-07-15 VITALS — BP 136/80 | HR 83 | Temp 98.2°F | Resp 17 | Ht 69.0 in | Wt 204.5 lb

## 2017-07-15 DIAGNOSIS — M62838 Other muscle spasm: Secondary | ICD-10-CM | POA: Diagnosis not present

## 2017-07-15 DIAGNOSIS — J4540 Moderate persistent asthma, uncomplicated: Secondary | ICD-10-CM

## 2017-07-15 MED ORDER — BECLOMETHASONE DIPROPIONATE 80 MCG/ACT IN AERS: 2.0000 | INHALATION_SPRAY | Freq: Two times a day (BID) | RESPIRATORY_TRACT | 0 refills | Status: DC

## 2017-07-15 NOTE — Assessment & Plan Note (Signed)
Deteriorated.  Pt is not using Qvar as directed.  Has known allergy to cats and has 3 cats living in his house.  Suspect his nocturnal cough and wheezing are untreated asthma sxs.  Refill provided on Qvar.  Encouraged him to set up appt w/ allergist.  Reviewed supportive care and red flags that should prompt return.  Pt expressed understanding and is in agreement w/ plan.
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
## 2017-07-15 NOTE — Patient Instructions (Addendum)
Schedule your complete physical in 3-4 months Start the Qvar- 2 puffs twice daily Continue the Protonix daily to decrease acid production Continue the Albuterol as needed for cough, shortness of breath, wheezing Call and schedule with your allergist Call with any questions or concerns Hang in there!!!

## 2017-08-30 DIAGNOSIS — R3915 Urgency of urination: Secondary | ICD-10-CM | POA: Diagnosis not present

## 2017-08-30 DIAGNOSIS — R351 Nocturia: Secondary | ICD-10-CM | POA: Diagnosis not present

## 2017-08-30 DIAGNOSIS — N401 Enlarged prostate with lower urinary tract symptoms: Secondary | ICD-10-CM | POA: Diagnosis not present

## 2017-08-30 DIAGNOSIS — R350 Frequency of micturition: Secondary | ICD-10-CM | POA: Diagnosis not present

## 2017-09-24 DIAGNOSIS — Z96642 Presence of left artificial hip joint: Secondary | ICD-10-CM | POA: Diagnosis not present

## 2017-09-24 DIAGNOSIS — M25551 Pain in right hip: Secondary | ICD-10-CM | POA: Diagnosis not present

## 2017-09-24 DIAGNOSIS — M25851 Other specified joint disorders, right hip: Secondary | ICD-10-CM | POA: Diagnosis not present

## 2017-09-27 ENCOUNTER — Telehealth: Payer: Self-pay | Admitting: *Deleted

## 2017-09-27 NOTE — Telephone Encounter (Signed)
Patient is aware of notes below.  Stated verbal understanding. He is going to look up a [place] office and try to get the labs done there.

## 2017-09-27 NOTE — Telephone Encounter (Signed)
Routing to PCP to see if this is something we can do.    If she agrees, I would need to see orders to get exact test to  make sure I have the correct tubes for that blood draw.     Copied from [place]. Topic: Inquiry >> Sep 27, 2017  1:22 PM [name] wrote: Reason for CRM: pt has lab order [facility] where he got his hip implant. The order is for: Metal ion levels: titanium, cobalt and chromium levels in blood./ Pt wants to know if he can come there and get.  Pt just had his 2 yr follow up with the ortho last Friday.

## 2017-09-27 NOTE — Telephone Encounter (Signed)
This is not typically something our lab handles.  I would recommend he take his order to an outpt lab facility ([facility], [place], etc)

## 2017-10-03 DIAGNOSIS — L0390 Cellulitis, unspecified: Secondary | ICD-10-CM | POA: Diagnosis not present

## 2017-10-07 DIAGNOSIS — J111 Influenza due to unidentified influenza virus with other respiratory manifestations: Secondary | ICD-10-CM | POA: Diagnosis not present

## 2017-10-11 ENCOUNTER — Encounter: Payer: Self-pay | Admitting: Family Medicine

## 2017-10-11 ENCOUNTER — Other Ambulatory Visit: Payer: Self-pay

## 2017-10-11 ENCOUNTER — Ambulatory Visit (INDEPENDENT_AMBULATORY_CARE_PROVIDER_SITE_OTHER): Payer: 59 | Admitting: Family Medicine

## 2017-10-11 VITALS — BP 121/81 | HR 86 | Temp 98.1°F | Resp 16 | Ht 69.0 in | Wt 197.2 lb

## 2017-10-11 DIAGNOSIS — R350 Frequency of micturition: Secondary | ICD-10-CM | POA: Diagnosis not present

## 2017-10-11 DIAGNOSIS — R351 Nocturia: Secondary | ICD-10-CM | POA: Diagnosis not present

## 2017-10-11 DIAGNOSIS — E663 Overweight: Secondary | ICD-10-CM | POA: Diagnosis not present

## 2017-10-11 DIAGNOSIS — M25552 Pain in left hip: Secondary | ICD-10-CM | POA: Diagnosis not present

## 2017-10-11 DIAGNOSIS — M25551 Pain in right hip: Secondary | ICD-10-CM | POA: Diagnosis not present

## 2017-10-11 DIAGNOSIS — Z0000 Encounter for general adult medical examination without abnormal findings: Secondary | ICD-10-CM | POA: Diagnosis not present

## 2017-10-11 DIAGNOSIS — N401 Enlarged prostate with lower urinary tract symptoms: Secondary | ICD-10-CM | POA: Diagnosis not present

## 2017-10-11 LAB — CBC WITH DIFFERENTIAL/PLATELET
BASOS PCT: 0.9 % (ref 0.0–3.0)
Basophils Absolute: 0 10*3/uL (ref 0.0–0.1)
EOS PCT: 5 % (ref 0.0–5.0)
Eosinophils Absolute: 0.2 10*3/uL (ref 0.0–0.7)
HCT: 42.9 % (ref 39.0–52.0)
Hemoglobin: 14.9 g/dL (ref 13.0–17.0)
Lymphocytes Relative: 26.8 % (ref 12.0–46.0)
Lymphs Abs: 1.3 10*3/uL (ref 0.7–4.0)
MCHC: 34.7 g/dL (ref 30.0–36.0)
MCV: 84.5 fl (ref 78.0–100.0)
MONO ABS: 0.4 10*3/uL (ref 0.1–1.0)
MONOS PCT: 7.5 % (ref 3.0–12.0)
NEUTROS PCT: 59.8 % (ref 43.0–77.0)
Neutro Abs: 2.9 10*3/uL (ref 1.4–7.7)
Platelets: 203 10*3/uL (ref 150.0–400.0)
RBC: 5.07 Mil/uL (ref 4.22–5.81)
RDW: 12.7 % (ref 11.5–15.5)
WBC: 4.8 10*3/uL (ref 4.0–10.5)

## 2017-10-11 NOTE — Progress Notes (Signed)
   Subjective:    Patient ID: Alexander Hernandez, male    DOB: 04/03/1976, 42 y.o.   MRN: [id number]  HPI CPE- UTD on urology, immunizations.   Review of Systems Patient reports no vision/hearing changes, anorexia, fever ,adenopathy, persistant/recurrent hoarseness, swallowing issues, chest pain, palpitations, edema, persistant/recurrent cough, hemoptysis, gastrointestinal  bleeding (melena, rectal bleeding), abdominal pain, excessive heart burn, GU symptoms (dysuria, hematuria, voiding/incontinence issues) syncope, focal weakness, memory loss, numbness & tingling, skin/hair/nail changes, depression, anxiety, abnormal bruising/bleeding, musculoskeletal symptoms/signs.   +SOB- not using Qvar.  Plans to make appt w/ allergist    Objective:   Physical Exam General Appearance:    Alert, cooperative, no distress, appears stated age  Head:    Normocephalic, without obvious abnormality, atraumatic  Eyes:    PERRL, conjunctiva/corneas clear, EOM's intact, fundi    benign, both eyes       Ears:    Normal TM's and external ear canals, both ears  Nose:   Nares normal, septum midline, mucosa normal, no drainage   or sinus tenderness  Throat:   Lips, mucosa, and tongue normal; teeth and gums normal  Neck:   Supple, symmetrical, trachea midline, no adenopathy;       thyroid:  No enlargement/tenderness/nodules  Back:     Symmetric, no curvature, ROM normal, no CVA tenderness  Lungs:     Clear to auscultation bilaterally, respirations unlabored  Chest wall:    No tenderness or deformity  Heart:    Regular rate and rhythm, S1 and S2 normal, no murmur, rub   or gallop  Abdomen:     Soft, non-tender, bowel sounds active all four quadrants,    no masses, no organomegaly  Genitalia:    Deferred to urology  Rectal:    Extremities:   Extremities normal, atraumatic, no cyanosis or edema  Pulses:   2+ and symmetric all extremities  Skin:   Skin color, texture, turgor normal, no rashes or lesions  Lymph nodes:    Cervical, supraclavicular, and axillary nodes normal  Neurologic:   CNII-XII intact. Normal strength, sensation and reflexes      throughout          Assessment & Plan:

## 2017-10-11 NOTE — Patient Instructions (Addendum)
Follow up in 1 year or as needed We'll notify you of your lab results and make any changes if needed Call your Dentist or Orthopedist for the antibiotics If you have another facial infection, schedule an appt so we can refer you to ENT Restart your Qvar to help with your breathing Call with any questions or concerns [place]!!!

## 2017-10-12 ENCOUNTER — Encounter: Payer: Self-pay | Admitting: General Practice

## 2017-10-12 LAB — HEPATIC FUNCTION PANEL
ALBUMIN: 4.1 g/dL (ref 3.5–5.2)
ALK PHOS: 57 U/L (ref 39–117)
ALT: 31 U/L (ref 0–53)
AST: 22 U/L (ref 0–37)
BILIRUBIN TOTAL: 0.4 mg/dL (ref 0.2–1.2)
Bilirubin, Direct: 0.1 mg/dL (ref 0.0–0.3)
Total Protein: 7 g/dL (ref 6.0–8.3)

## 2017-10-12 LAB — BASIC METABOLIC PANEL
BUN: 11 mg/dL (ref 6–23)
CO2: 29 mEq/L (ref 19–32)
Calcium: 9.2 mg/dL (ref 8.4–10.5)
Chloride: 102 mEq/L (ref 96–112)
Creatinine, Ser: 1.16 mg/dL (ref 0.40–1.50)
GFR: 72.72 mL/min (ref >60.00)
Glucose, Bld: 90 mg/dL (ref 70–99)
POTASSIUM: 4 meq/L (ref 3.5–5.1)
SODIUM: 139 meq/L (ref 135–145)

## 2017-10-12 LAB — TSH: TSH: 1.47 u[IU]/mL (ref 0.35–4.50)

## 2017-10-12 LAB — LIPID PANEL
CHOL/HDL RATIO: 4
Cholesterol: 117 mg/dL (ref 0–200)
HDL: 31.2 mg/dL — ABNORMAL LOW (ref >39.00)
LDL Cholesterol: 58 mg/dL (ref 0–99)
NONHDL: 85.54
Triglycerides: 140 mg/dL (ref 0.0–149.0)
VLDL: 28 mg/dL (ref 0.0–40.0)

## 2017-10-12 NOTE — Assessment & Plan Note (Signed)
Pt's PE WNL.  UTD on Tdap and flu.  Check labs.  Anticipatory guidance provided.

## 2017-10-22 DIAGNOSIS — M25552 Pain in left hip: Secondary | ICD-10-CM | POA: Diagnosis not present

## 2017-10-25 ENCOUNTER — Other Ambulatory Visit: Payer: Self-pay | Admitting: Orthopedic Surgery

## 2017-10-25 DIAGNOSIS — Z96642 Presence of left artificial hip joint: Secondary | ICD-10-CM

## 2017-10-29 ENCOUNTER — Ambulatory Visit
Admission: RE | Admit: 2017-10-29 | Discharge: 2017-10-29 | Disposition: A | Discharge status: Home / Self Care | Payer: 59 | Source: Ambulatory Visit | Attending: Orthopedic Surgery | Admitting: Orthopedic Surgery

## 2017-10-29 ENCOUNTER — Other Ambulatory Visit: Payer: Self-pay | Admitting: Orthopedic Surgery

## 2017-10-29 DIAGNOSIS — Z96642 Presence of left artificial hip joint: Secondary | ICD-10-CM

## 2017-10-29 DIAGNOSIS — M25512 Pain in left shoulder: Secondary | ICD-10-CM | POA: Diagnosis not present

## 2017-10-29 MED ORDER — METHYLPREDNISOLONE ACETATE 40 MG/ML INJ SUSP (RADIOLOG: 120.0000 mg | Freq: Once | INTRAMUSCULAR | Status: DC

## 2017-12-24 DIAGNOSIS — M25552 Pain in left hip: Secondary | ICD-10-CM | POA: Diagnosis not present

## 2017-12-24 DIAGNOSIS — M25561 Pain in right knee: Secondary | ICD-10-CM | POA: Diagnosis not present

## 2018-01-13 DIAGNOSIS — M8589 Other specified disorders of bone density and structure, multiple sites: Secondary | ICD-10-CM | POA: Diagnosis not present

## 2018-01-13 DIAGNOSIS — Z13820 Encounter for screening for osteoporosis: Secondary | ICD-10-CM | POA: Diagnosis not present

## 2018-01-19 DIAGNOSIS — R5383 Other fatigue: Secondary | ICD-10-CM | POA: Diagnosis not present

## 2018-01-19 DIAGNOSIS — M180 Bilateral primary osteoarthritis of first carpometacarpal joints: Secondary | ICD-10-CM | POA: Diagnosis not present

## 2018-01-19 DIAGNOSIS — E559 Vitamin D deficiency, unspecified: Secondary | ICD-10-CM | POA: Diagnosis not present

## 2018-01-19 DIAGNOSIS — N401 Enlarged prostate with lower urinary tract symptoms: Secondary | ICD-10-CM | POA: Diagnosis not present

## 2018-02-03 DIAGNOSIS — M8580 Other specified disorders of bone density and structure, unspecified site: Secondary | ICD-10-CM | POA: Diagnosis not present

## 2018-06-28 DIAGNOSIS — J0100 Acute maxillary sinusitis, unspecified: Secondary | ICD-10-CM | POA: Diagnosis not present

## 2018-07-06 DIAGNOSIS — Z9889 Other specified postprocedural states: Secondary | ICD-10-CM | POA: Diagnosis not present

## 2018-07-06 DIAGNOSIS — H04123 Dry eye syndrome of bilateral lacrimal glands: Secondary | ICD-10-CM | POA: Diagnosis not present

## 2018-07-06 DIAGNOSIS — S0501XA Injury of conjunctiva and corneal abrasion without foreign body, right eye, initial encounter: Secondary | ICD-10-CM | POA: Diagnosis not present

## 2018-10-13 ENCOUNTER — Encounter: Payer: Self-pay | Admitting: Family Medicine

## 2018-10-13 ENCOUNTER — Ambulatory Visit (INDEPENDENT_AMBULATORY_CARE_PROVIDER_SITE_OTHER): Payer: 59 | Admitting: Family Medicine

## 2018-10-13 ENCOUNTER — Other Ambulatory Visit: Payer: Self-pay

## 2018-10-13 VITALS — BP 124/86 | HR 90 | Temp 97.9°F | Resp 16 | Ht 69.0 in | Wt 206.0 lb

## 2018-10-13 DIAGNOSIS — Z23 Encounter for immunization: Secondary | ICD-10-CM

## 2018-10-13 DIAGNOSIS — E663 Overweight: Secondary | ICD-10-CM | POA: Insufficient documentation

## 2018-10-13 DIAGNOSIS — E669 Obesity, unspecified: Secondary | ICD-10-CM | POA: Insufficient documentation

## 2018-10-13 DIAGNOSIS — L989 Disorder of the skin and subcutaneous tissue, unspecified: Secondary | ICD-10-CM | POA: Diagnosis not present

## 2018-10-13 DIAGNOSIS — Z0000 Encounter for general adult medical examination without abnormal findings: Secondary | ICD-10-CM

## 2018-10-13 DIAGNOSIS — J4540 Moderate persistent asthma, uncomplicated: Secondary | ICD-10-CM

## 2018-10-13 MED ORDER — MONTELUKAST SODIUM 10 MG PO TABS: 10.0000 mg | ORAL_TABLET | Freq: Every day | ORAL | 1 refills | Status: DC

## 2018-10-13 MED ORDER — BECLOMETHASONE DIPROPIONATE 80 MCG/ACT IN AERS: 2.0000 | INHALATION_SPRAY | Freq: Two times a day (BID) | RESPIRATORY_TRACT | 6 refills | Status: DC

## 2018-10-13 MED ORDER — ALBUTEROL SULFATE HFA 108 (90 BASE) MCG/ACT IN AERS: 2.0000 | INHALATION_SPRAY | Freq: Four times a day (QID) | RESPIRATORY_TRACT | 6 refills | Status: DC | PRN

## 2018-10-13 NOTE — Progress Notes (Signed)
   Subjective:    Patient ID: Alexander Hernandez, male    DOB: [birth year], 43 y.o.   MRN: [id number]  HPI CPE- UTD on Tdap.  Due for flu.   Review of Systems Patient reports no hearing changes, anorexia, fever ,adenopathy, persistant/recurrent hoarseness, swallowing issues, chest pain, palpitations, edema, persistant/recurrent cough, hemoptysis, dyspnea (rest,exertional, paroxysmal nocturnal), gastrointestinal  bleeding (melena, rectal bleeding), abdominal pain, excessive heart burn, GU symptoms (dysuria, hematuria, voiding/incontinence issues) syncope, focal weakness, memory loss, numbness & tingling, hair/nail changes, depression, anxiety, abnormal bruising/bleeding, musculoskeletal symptoms/signs.   Skin lesion- R forearm, has doubled in size in last few months + vision loss in R eye after injury    Objective:   Physical Exam General Appearance:    Alert, cooperative, no distress, appears stated age  Head:    Normocephalic, without obvious abnormality, atraumatic  Eyes:    PERRL, conjunctiva/corneas clear, EOM's intact, fundi    benign, both eyes       Ears:    Normal TM's and external ear canals, both ears  Nose:   Nares normal, septum midline, mucosa normal, no drainage   or sinus tenderness  Throat:   Lips, mucosa, and tongue normal; teeth and gums normal  Neck:   Supple, symmetrical, trachea midline, no adenopathy;       thyroid:  No enlargement/tenderness/nodules  Back:     Symmetric, no curvature, ROM normal, no CVA tenderness  Lungs:     Clear to auscultation bilaterally, respirations unlabored  Chest wall:    No tenderness or deformity  Heart:    Regular rate and rhythm, S1 and S2 normal, no murmur, rub   or gallop  Abdomen:     Soft, non-tender, bowel sounds active all four quadrants,    no masses, no organomegaly  Genitalia:    Normal male without lesion, masses,discharge or tenderness  Rectal:    Deferred due to young age  Extremities:   Extremities normal, atraumatic, no  cyanosis or edema  Pulses:   2+ and symmetric all extremities  Skin:   Skin color, texture, turgor normal, no rashes, 1 cm hyperpigmented area on R forearm  Lymph nodes:   Cervical, supraclavicular, and axillary nodes normal  Neurologic:   CNII-XII intact. Normal strength, sensation and reflexes      throughout          Assessment & Plan:

## 2018-10-13 NOTE — Assessment & Plan Note (Signed)
Pt's PE WNL w/ exception of obesity.  Flu shot given today.  Check labs.  Anticipatory guidance provided.

## 2018-10-13 NOTE — Patient Instructions (Signed)
Follow up in 1 year or as needed We'll notify you of your lab results and make any changes if needed Continue to work on healthy diet and regular exercise We'll call you with your Dermatology appt Follow up with your eye doctor! Call with any questions or concerns [place] [employer]!!

## 2018-10-13 NOTE — Assessment & Plan Note (Signed)
Pt has gained 9 lbs since last visit.  Stressed need for healthy diet and regular exercise.  Check labs to risk stratify.

## 2018-10-14 ENCOUNTER — Encounter: Payer: Self-pay | Admitting: General Practice

## 2018-10-14 LAB — CBC WITH DIFFERENTIAL/PLATELET
BASOS ABS: 0.1 10*3/uL (ref 0.0–0.1)
Basophils Relative: 0.9 % (ref 0.0–3.0)
EOS PCT: 4.9 % (ref 0.0–5.0)
Eosinophils Absolute: 0.4 10*3/uL (ref 0.0–0.7)
HCT: 45.6 % (ref 39.0–52.0)
Hemoglobin: 15.5 g/dL (ref 13.0–17.0)
LYMPHS ABS: 1.4 10*3/uL (ref 0.7–4.0)
Lymphocytes Relative: 18.2 % (ref 12.0–46.0)
MCHC: 34.1 g/dL (ref 30.0–36.0)
MCV: 87.1 fl (ref 78.0–100.0)
Monocytes Absolute: 0.7 10*3/uL (ref 0.1–1.0)
Monocytes Relative: 8.6 % (ref 3.0–12.0)
NEUTROS PCT: 67.4 % (ref 43.0–77.0)
Neutro Abs: 5.3 10*3/uL (ref 1.4–7.7)
PLATELETS: 253 10*3/uL (ref 150.0–400.0)
RBC: 5.24 Mil/uL (ref 4.22–5.81)
RDW: 12.7 % (ref 11.5–15.5)
WBC: 7.9 10*3/uL (ref 4.0–10.5)

## 2018-10-14 LAB — BASIC METABOLIC PANEL
BUN: 14 mg/dL (ref 6–23)
CALCIUM: 9.5 mg/dL (ref 8.4–10.5)
CO2: 30 meq/L (ref 19–32)
Chloride: 104 mEq/L (ref 96–112)
Creatinine, Ser: 1.06 mg/dL (ref 0.40–1.50)
GFR: 75.57 mL/min (ref >60.00)
Glucose, Bld: 74 mg/dL (ref 70–99)
POTASSIUM: 4 meq/L (ref 3.5–5.1)
SODIUM: 141 meq/L (ref 135–145)

## 2018-10-14 LAB — LIPID PANEL
Cholesterol: 176 mg/dL (ref 0–200)
HDL: 48.1 mg/dL (ref >39.00)
LDL CALC: 112 mg/dL — AB (ref 0–99)
NONHDL: 127.47
Total CHOL/HDL Ratio: 4
Triglycerides: 76 mg/dL (ref 0.0–149.0)
VLDL: 15.2 mg/dL (ref 0.0–40.0)

## 2018-10-14 LAB — HEPATIC FUNCTION PANEL
ALT: 25 U/L (ref 0–53)
AST: 22 U/L (ref 0–37)
Albumin: 4.6 g/dL (ref 3.5–5.2)
Alkaline Phosphatase: 60 U/L (ref 39–117)
BILIRUBIN DIRECT: 0.1 mg/dL (ref 0.0–0.3)
BILIRUBIN TOTAL: 0.7 mg/dL (ref 0.2–1.2)
Total Protein: 7.1 g/dL (ref 6.0–8.3)

## 2018-10-14 LAB — TSH: TSH: 1.02 u[IU]/mL (ref 0.35–4.50)

## 2018-11-28 ENCOUNTER — Encounter: Payer: Self-pay | Admitting: Family Medicine

## 2018-11-28 ENCOUNTER — Ambulatory Visit (INDEPENDENT_AMBULATORY_CARE_PROVIDER_SITE_OTHER): Payer: 59 | Admitting: Family Medicine

## 2018-11-28 VITALS — BP 150/100 | Ht 69.0 in | Wt 195.0 lb

## 2018-11-28 DIAGNOSIS — J4541 Moderate persistent asthma with (acute) exacerbation: Secondary | ICD-10-CM

## 2018-11-28 MED ORDER — PREDNISONE 10 MG PO TABS: ORAL_TABLET | ORAL | 0 refills | Status: DC

## 2018-11-28 NOTE — Progress Notes (Signed)
Virtual Visit via Video   I connected with patient on 11/28/18 at  9:00 AM EDT by a video enabled telemedicine application and verified that I am speaking with the correct person using two identifiers.  Location patient: Home Location provider: [facility], [facility] Persons participating in the virtual visit: Patient, Provider, [place] ([place])  I discussed the limitations of evaluation and management by telemedicine and the availability of in person appointments. The patient expressed understanding and agreed to proceed.  Subjective:   HPI:   Asthma- Chronic problem for pt.  Pt is on Qvar 80mg  BID and Albuterol PRN.  He is also on Singulair, Flonase, Zyrtec D, and Astelin for seasonal allergies.  Pt reports sxs for the last week- cleared brush and mowed 2 yards.  Has had increased wheezing.  Having to wake in the middle of the night to take Benadryl.  Last night had bronchospasm which triggered anxiety- used Albuterol twice overnight and twice this AM.  'I feel like i'm breathing through a straw'.  No fevers, no known sick contacts.  ROS:   See pertinent positives and negatives per HPI.  Patient Active Problem List   Diagnosis Date Noted  . Obesity (BMI 30-39.9) 10/13/2018  . Physical exam 10/11/2017  . Allergic rhinoconjunctivitis 07/01/2016  . Allergy-induced asthma 06/02/2016  . Anxiety state 12/12/2015  . Memory loss 12/12/2015  . Dysphagia, pharyngoesophageal 07/18/2014  . Left hip pain 11/24/2013  . Fungal infection of foot 07/20/2011  . HTN (hypertension) 06/19/2011  . Urinary frequency 06/19/2011  . SHOULDER, PAIN 03/20/2009  . PARESTHESIA, HANDS 12/11/2008    Social History   Tobacco Use  . Smoking status: Never Smoker  . Smokeless tobacco: Never Used  Substance Use Topics  . Alcohol use: Yes    Alcohol/week: 0.0 standard drinks    Comment: occasional    Current Outpatient Medications:  .  albuterol (PROVENTIL HFA;VENTOLIN HFA) 108 (90 Base)  MCG/ACT inhaler, Inhale 2 puffs into the lungs every 6 (six) hours as needed for wheezing or shortness of breath., Disp: 1 Inhaler, Rfl: 6 .  Ascorbic Acid (VITAMIN C) 1000 MG tablet, Take 1,000 mg by mouth daily., Disp: , Rfl:  .  azelastine (ASTELIN) 0.1 % nasal spray, Place 2 sprays into both nostrils 2 (two) times daily. Use in each nostril as directed, Disp: 30 mL, Rfl: 5 .  beclomethasone (QVAR) 80 MCG/ACT inhaler, Inhale 2 puffs into the lungs 2 (two) times daily., Disp: 1 Inhaler, Rfl: 6 .  calcium carbonate (OSCAL) 1500 (600 Ca) MG TABS tablet, Take 600 mg of elemental calcium by mouth daily with breakfast., Disp: , Rfl:  .  cetirizine-pseudoephedrine (ZYRTEC-D ALLERGY & CONGESTION) 5-120 MG tablet, Take 1 tablet 2 (two) times daily by mouth., Disp: 60 tablet, Rfl: 1 .  Cholecalciferol (VITAMIN D) 2000 units CAPS, Take 1 capsule by mouth daily., Disp: , Rfl:  .  fluticasone (FLONASE) 50 MCG/ACT nasal spray, Place 2 sprays at bedtime into both nostrils., Disp: 16 g, Rfl: prn .  magnesium gluconate (MAGONATE) 500 MG tablet, Take 500 mg by mouth 2 (two) times daily., Disp: , Rfl:  .  montelukast (SINGULAIR) 10 MG tablet, Take 1 tablet (10 mg total) by mouth at bedtime., Disp: 90 tablet, Rfl: 1 .  mupirocin ointment (BACTROBAN) 2 %, , Disp: , Rfl: 0 .  Omega-3 Fatty Acids (OMEGA-3 FISH OIL) 1200 MG CAPS, Take 1 capsule by mouth daily., Disp: , Rfl:  .  OVER THE COUNTER MEDICATION, BEET ROOT  1 QD, Disp: , Rfl:  .  oxybutynin (DITROPAN-XL) 10 MG 24 hr tablet, Take 10 mg by mouth daily., Disp: , Rfl:  .  pantoprazole (PROTONIX) 20 MG tablet, Take 1 tablet (20 mg total) by mouth daily., Disp: 30 tablet, Rfl: 0 .  tamsulosin (FLOMAX) 0.4 MG CAPS capsule, Take 0.4 mg daily by mouth. , Disp: , Rfl:  .  vitamin k 100 MCG tablet, Take 100 mcg by mouth daily., Disp: , Rfl:   Allergies  Allergen Reactions  . Codeine Other (See Comments)    REACTION: skin irritation, pains, tenderness to touch  .  Other Other (See Comments)    Narcotics-prefers not to take meds    Objective:   BP (!) 150/100   Ht 5\' 9"  (1.753 m)   Wt 195 lb (88.5 kg)   BMI 28.80 kg/m   AAOx3, NAD NCAT, EOMI No obvious CN deficits Near constant clearing of throat + nasal congestion Coloring WNL Pt is able to speak clearly, coherently without shortness of breath or increased work of breathing.  Thought process is linear.  Mood is anxious  Assessment and Plan:   Asthma exacerbation- new.  Pt has only been using Qvar once daily rather than the twice it was prescribed.  Pt is now aware to increase dose.  He shows no sxs of infxn- no fever, chills- and no known sick contacts.  Will start Prednisone taper but pt was cautioned regarding immunosuppression and need to practice even greater social distancing.  Encouraged him to use a mask while outside to decrease pollen exposure.  Albuterol prn.  Pt will notify later this week if not improving and immediately if sxs worsen.  Pt expressed understanding and is in agreement w/ plan.    Annye Asa, MD 11/28/2018

## 2018-11-28 NOTE — Progress Notes (Signed)
I have discussed the procedure for the virtual visit with the patient who has given consent to proceed with assessment and treatment.   BETHANY DILLARD, CMA     

## 2018-12-01 ENCOUNTER — Telehealth: Payer: Self-pay | Admitting: Family Medicine

## 2018-12-01 NOTE — Telephone Encounter (Signed)
Is he taking the Prednisone as directed?  Has he increased his Qvar to twice daily?  And does he have a fever associated w/ shortness of breath?  Cough?  Is he having daytime sxs or just at night?

## 2018-12-01 NOTE — Telephone Encounter (Signed)
Delsym or robitussin would also help.  Make sure he is taking his allergy medication and limiting his time outside or wearing a mask to reduce exposure to pollen.

## 2018-12-01 NOTE — Telephone Encounter (Signed)
Routing to PCP to advise

## 2018-12-01 NOTE — Telephone Encounter (Signed)
Pt states that he is not much better from Mondays visit and has been using his inhaler 2 x in the evening and 2 x in the middle of the night. Pt states that his chest is still tight feeling and asking what should her do. Please advise.

## 2018-12-01 NOTE — Telephone Encounter (Signed)
Patient states that he is taking the prednisone, and he has increased his Qvar.  States that he has not had any fever, but he has a constant cough that is keeping him up at night. He states that he can feel the congestion/fluid.  Dr. Birdie Riddle advised to add on Mucinex DM - patient said that he has taken that in the past it has made him feel really bad and he wants to know if there is anything besides the Mucinex DM that he could take.

## 2018-12-01 NOTE — Telephone Encounter (Signed)
Spoke with patient to let him know the details below.  He is going to follow this advise, and he will make contact with me tomorrow to let me know how he is doing with the SOB/Cough.

## 2018-12-08 DIAGNOSIS — Z1283 Encounter for screening for malignant neoplasm of skin: Secondary | ICD-10-CM | POA: Diagnosis not present

## 2018-12-08 DIAGNOSIS — L820 Inflamed seborrheic keratosis: Secondary | ICD-10-CM | POA: Diagnosis not present

## 2018-12-08 DIAGNOSIS — D225 Melanocytic nevi of trunk: Secondary | ICD-10-CM | POA: Diagnosis not present

## 2018-12-12 NOTE — Telephone Encounter (Signed)
Pt called stating he is still feeling short of breath. He states he did  couhg up blood this weekend but he didn't see concerned. Pt states hes been taking abuterol once every 2 hours and wondered if he needed to be seen again. Please advise.

## 2018-12-13 ENCOUNTER — Other Ambulatory Visit: Payer: Self-pay

## 2018-12-13 ENCOUNTER — Ambulatory Visit (INDEPENDENT_AMBULATORY_CARE_PROVIDER_SITE_OTHER): Payer: 59 | Admitting: Physician Assistant

## 2018-12-13 ENCOUNTER — Ambulatory Visit (HOSPITAL_BASED_OUTPATIENT_CLINIC_OR_DEPARTMENT_OTHER)
Admission: RE | Admit: 2018-12-13 | Discharge: 2018-12-13 | Disposition: A | Discharge status: Home / Self Care | Payer: 59 | Source: Ambulatory Visit | Attending: Physician Assistant | Admitting: Physician Assistant

## 2018-12-13 ENCOUNTER — Encounter: Payer: Self-pay | Admitting: Physician Assistant

## 2018-12-13 ENCOUNTER — Ambulatory Visit: Payer: Self-pay | Admitting: Family Medicine

## 2018-12-13 DIAGNOSIS — J208 Acute bronchitis due to other specified organisms: Secondary | ICD-10-CM | POA: Insufficient documentation

## 2018-12-13 DIAGNOSIS — J4541 Moderate persistent asthma with (acute) exacerbation: Secondary | ICD-10-CM

## 2018-12-13 DIAGNOSIS — B9689 Other specified bacterial agents as the cause of diseases classified elsewhere: Secondary | ICD-10-CM

## 2018-12-13 DIAGNOSIS — J189 Pneumonia, unspecified organism: Secondary | ICD-10-CM | POA: Diagnosis not present

## 2018-12-13 MED ORDER — DOXYCYCLINE HYCLATE 100 MG PO CAPS: 100.0000 mg | ORAL_CAPSULE | Freq: Two times a day (BID) | ORAL | 0 refills | Status: DC

## 2018-12-13 MED ORDER — FLUTICASONE-SALMETEROL 250-50 MCG/DOSE IN AEPB: 1.0000 | INHALATION_SPRAY | Freq: Two times a day (BID) | RESPIRATORY_TRACT | 0 refills | Status: DC

## 2018-12-13 NOTE — Telephone Encounter (Signed)
Wanted to be seen today scheduled with [facility].

## 2018-12-13 NOTE — Progress Notes (Signed)
I have discussed the procedure for the virtual visit with the patient who has given consent to proceed with assessment and treatment.   Havish Petties S Jenina Moening, CMA     

## 2018-12-13 NOTE — Progress Notes (Signed)
Virtual Visit via Video   I connected with patient on 12/13/18 at 11:20 AM EDT by a video enabled telemedicine application and verified that I am speaking with the correct person using two identifiers.  Location patient: Home Location provider: [facility], Office Persons participating in the virtual visit: Patient, Provider, [place] (Patina Moore)  I discussed the limitations of evaluation and management by telemedicine and the availability of in person appointments. The patient expressed understanding and agreed to proceed.  Subjective:   HPI:   Patient presents via [facility] today c/p 4-5 weeks of chest congestion with cough productive of yellow sputum and increased asthma symptoms. Denies fever, chills, aches. Has significant seasonal allergies with continued PND despite his regimen (Fluticasone, Astelin, Singulair, Zyrtec), but denies sinus pain, ear pain or tooth pain. Denies recent travel or sick contact. He was assessed initially by his PCP on 11/28/2018 and diagnosed with asthma exacerbation. Was placed on prednisone taper starting at 30 mg x 3 days and Qvar added to regimen which he endorses completing as directed. Noted only some improvement with this. Is currently using his Albuterol inhaler every 3-4 hours along with his twice daily Qvar.  ROS:   See pertinent positives and negatives per HPI.  Patient Active Problem List   Diagnosis Date Noted  . Obesity (BMI 30-39.9) 10/13/2018  . Physical exam 10/11/2017  . Allergic rhinoconjunctivitis 07/01/2016  . Allergy-induced asthma 06/02/2016  . Anxiety state 12/12/2015  . Memory loss 12/12/2015  . Dysphagia, pharyngoesophageal 07/18/2014  . Left hip pain 11/24/2013  . Fungal infection of foot 07/20/2011  . HTN (hypertension) 06/19/2011  . Urinary frequency 06/19/2011  . SHOULDER, PAIN 03/20/2009  . PARESTHESIA, HANDS 12/11/2008    Social History   Tobacco Use  . Smoking status: Never Smoker  . Smokeless tobacco:  Never Used  Substance Use Topics  . Alcohol use: Yes    Alcohol/week: 0.0 standard drinks    Comment: occasional    Current Outpatient Medications:  .  albuterol (PROVENTIL HFA;VENTOLIN HFA) 108 (90 Base) MCG/ACT inhaler, Inhale 2 puffs into the lungs every 6 (six) hours as needed for wheezing or shortness of breath., Disp: 1 Inhaler, Rfl: 6 .  Ascorbic Acid (VITAMIN C) 1000 MG tablet, Take 1,000 mg by mouth daily., Disp: , Rfl:  .  azelastine (ASTELIN) 0.1 % nasal spray, Place 2 sprays into both nostrils 2 (two) times daily. Use in each nostril as directed, Disp: 30 mL, Rfl: 5 .  beclomethasone (QVAR) 80 MCG/ACT inhaler, Inhale 2 puffs into the lungs 2 (two) times daily., Disp: 1 Inhaler, Rfl: 6 .  calcium carbonate (OSCAL) 1500 (600 Ca) MG TABS tablet, Take 600 mg of elemental calcium by mouth daily with breakfast., Disp: , Rfl:  .  cetirizine-pseudoephedrine (ZYRTEC-D ALLERGY & CONGESTION) 5-120 MG tablet, Take 1 tablet 2 (two) times daily by mouth., Disp: 60 tablet, Rfl: 1 .  Cholecalciferol (VITAMIN D) 2000 units CAPS, Take 1 capsule by mouth daily., Disp: , Rfl:  .  fluticasone (FLONASE) 50 MCG/ACT nasal spray, Place 2 sprays at bedtime into both nostrils., Disp: 16 g, Rfl: prn .  magnesium gluconate (MAGONATE) 500 MG tablet, Take 500 mg by mouth 2 (two) times daily., Disp: , Rfl:  .  montelukast (SINGULAIR) 10 MG tablet, Take 1 tablet (10 mg total) by mouth at bedtime., Disp: 90 tablet, Rfl: 1 .  mupirocin ointment (BACTROBAN) 2 %, , Disp: , Rfl: 0 .  Omega-3 Fatty Acids (OMEGA-3 FISH OIL) 1200 MG CAPS,  Take 1 capsule by mouth daily., Disp: , Rfl:  .  OVER THE COUNTER MEDICATION, BEET ROOT 1 QD, Disp: , Rfl:  .  oxybutynin (DITROPAN-XL) 10 MG 24 hr tablet, Take 10 mg by mouth daily., Disp: , Rfl:  .  pantoprazole (PROTONIX) 20 MG tablet, Take 1 tablet (20 mg total) by mouth daily., Disp: 30 tablet, Rfl: 0 .  tamsulosin (FLOMAX) 0.4 MG CAPS capsule, Take 0.4 mg daily by mouth. , Disp: ,  Rfl:  .  vitamin k 100 MCG tablet, Take 100 mcg by mouth daily., Disp: , Rfl:   Allergies  Allergen Reactions  . Codeine Other (See Comments)    REACTION: skin irritation, pains, tenderness to touch  . Other Other (See Comments)    Narcotics-prefers not to take meds    Objective:   There were no vitals taken for this visit.  Patient is well-developed, well-nourished in no acute distress.  Resting comfortably in seat of his parked vehicle. Head is normocephalic, atraumatic.  No labored breathing. No audible wheeze or observed dyspnea. Speech is clear and coherent with logical content.  Patient is alert and oriented at baseline.   Assessment and Plan:   1. Acute bacterial bronchitis 2. Moderate persistent asthma with exacerbation Rx Doxycycline.  Increase fluids.  Rest.  Saline nasal spray.  Probiotic.  Mucinex as directed.  Humidifier in bedroom. Switch Qvar to Advair. CXR today to assess for potential pneumonia.     - DG Chest 2 View; Future    Leeanne Rio, PA-C 12/13/2018

## 2018-12-13 NOTE — Telephone Encounter (Signed)
Pt. Reports 4 weeks ago started coughing. Productive with yellow mucus and streaks of blood. Does have asthma and is using his inhaler. Has chest pain/tightness. No fever.Increased shortness of breath. Requesting a virtual visit. Scheduling line busy.  Answer Assessment - Initial Assessment Questions 1. ONSET: "When did the cough begin?"      4 weeks ago 2. SEVERITY: "How bad is the cough today?"      Severe 3. RESPIRATORY DISTRESS: "Describe your breathing."      Some shortness of breath 4. FEVER: "Do you have a fever?" If so, ask: "What is your temperature, how was it measured, and when did it start?"     No 5. SPUTUM: "Describe the color of your sputum" (clear, white, yellow, green)     Yellow 6. HEMOPTYSIS: "Are you coughing up any blood?" If so ask: "How much?" (flecks, streaks, tablespoons, etc.)     Streaks 7. CARDIAC HISTORY: "Do you have any history of heart disease?" (e.g., heart attack, congestive heart failure)      No 8. LUNG HISTORY: "Do you have any history of lung disease?"  (e.g., pulmonary embolus, asthma, emphysema)     Asthma 9. PE RISK FACTORS: "Do you have a history of blood clots?" (or: recent major surgery, recent prolonged travel, bedridden)     No 10. OTHER SYMPTOMS: "Do you have any other symptoms?" (e.g., runny nose, wheezing, chest pain)       Wheezing, chest pain 11. PREGNANCY: "Is there any chance you are pregnant?" "When was your last menstrual period?"       n/a 12. TRAVEL: "Have you traveled out of the country in the last month?" (e.g., travel history, exposures)       No  Protocols used: [place]

## 2018-12-13 NOTE — Patient Instructions (Addendum)
Instructions sent to MyChart.   Please go to [facility] for imaging. We will call you with your results and alter treatment accordingly.  [address] [place], [place] [address]   Take antibiotic (Doxycycline) as directed.  Increase fluids.  Get plenty of rest. Use Mucinex for congestion. Continue allergy medications. I am switching your Qvar to Advair twice daily. Take a daily probiotic (I recommend Align or Culturelle, but even Activia Yogurt may be beneficial).  A humidifier placed in the bedroom may offer some relief for a dry, scratchy throat of nasal irritation.  Read information below on acute bronchitis. Please call or return to clinic if symptoms are not improving

## 2018-12-13 NOTE — Telephone Encounter (Signed)
Previous note has been sent to [facility] for scheduling.

## 2018-12-14 ENCOUNTER — Encounter: Payer: Self-pay | Admitting: Physician Assistant

## 2018-12-25 IMAGING — CR DG CHEST 2V
2 series · 2 of 2 positions shown · non-contrast
Comparison: 12/28/2016

CLINICAL DATA: Cough, shortness of breath

EXAM:
CHEST  2 VIEW

[w chest pa]
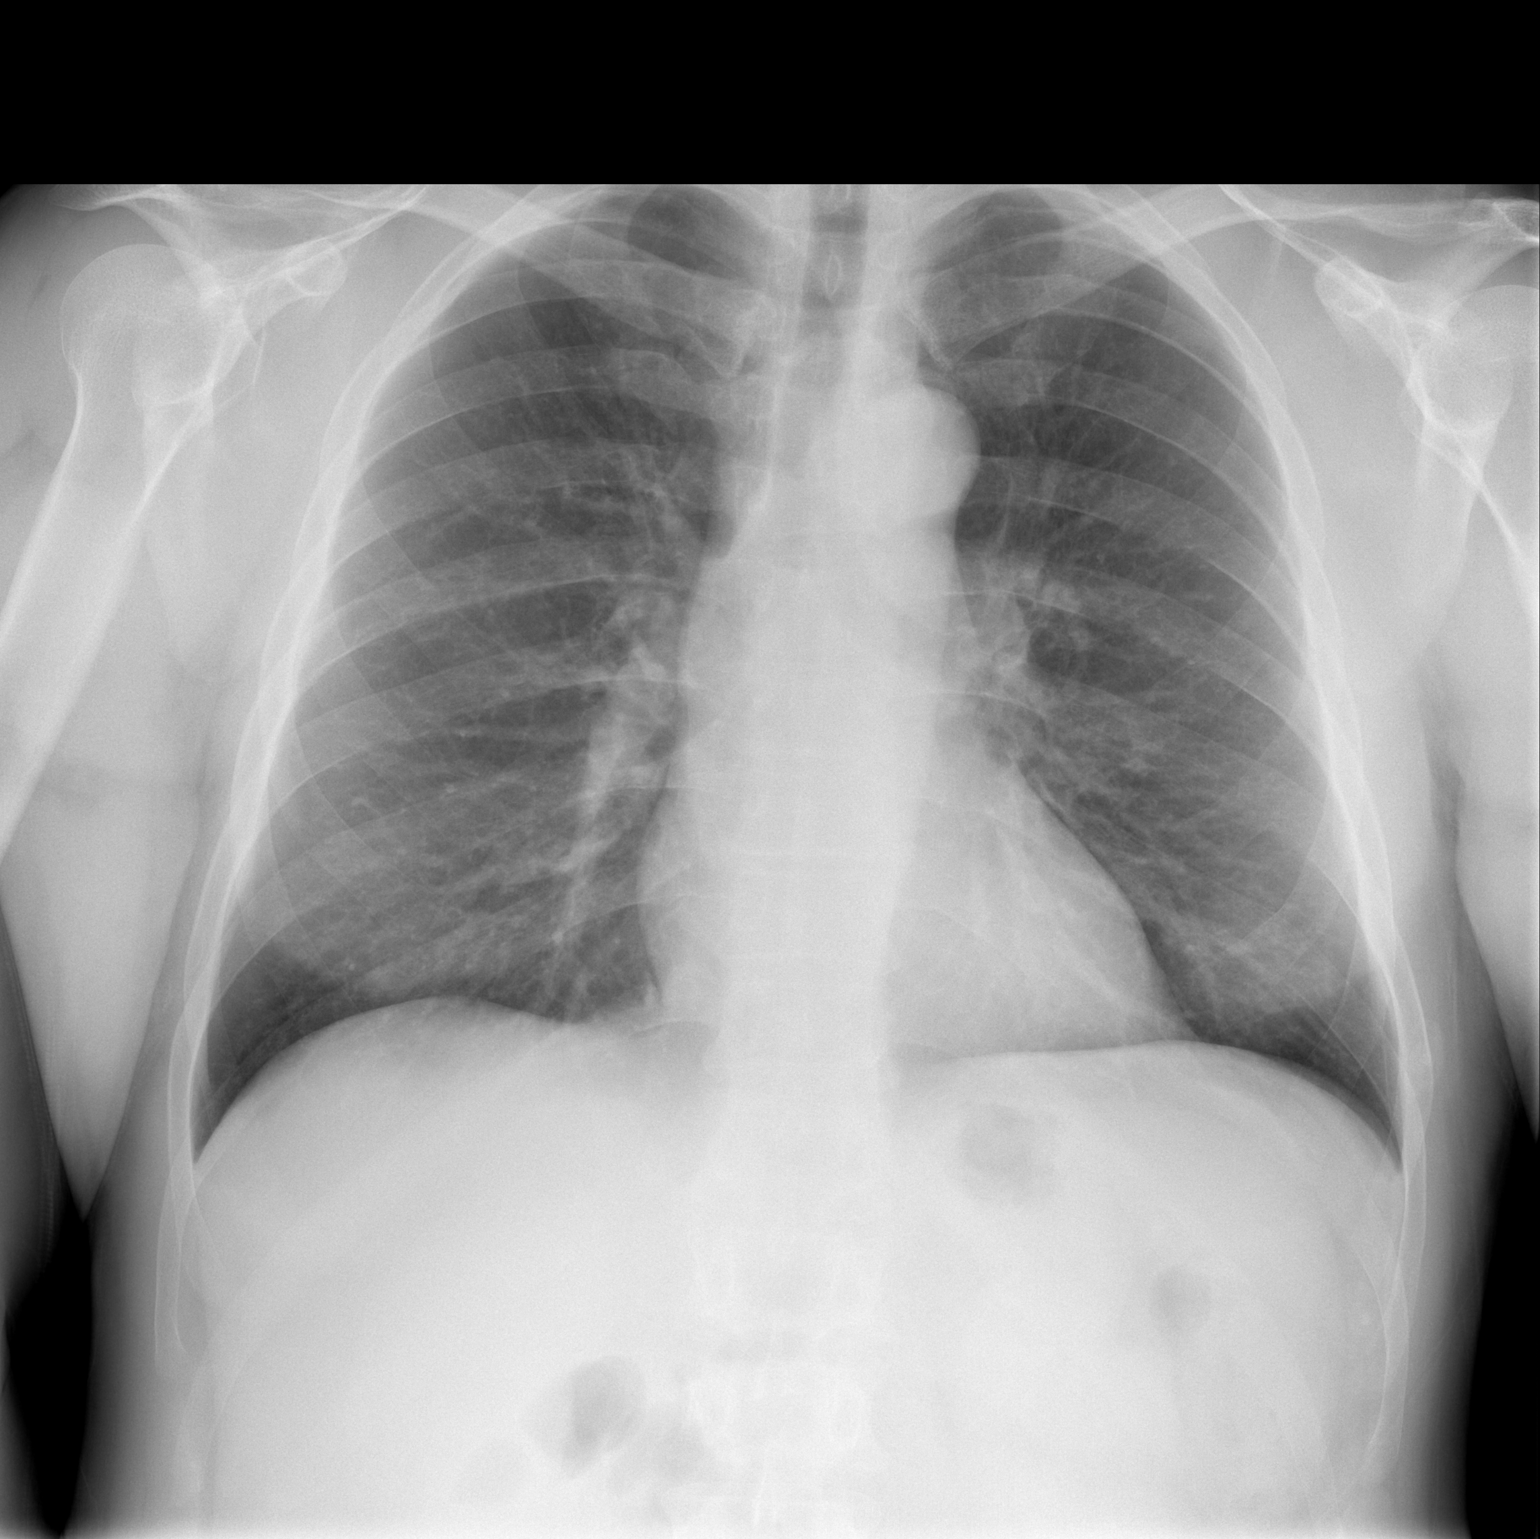

[w chest lat]
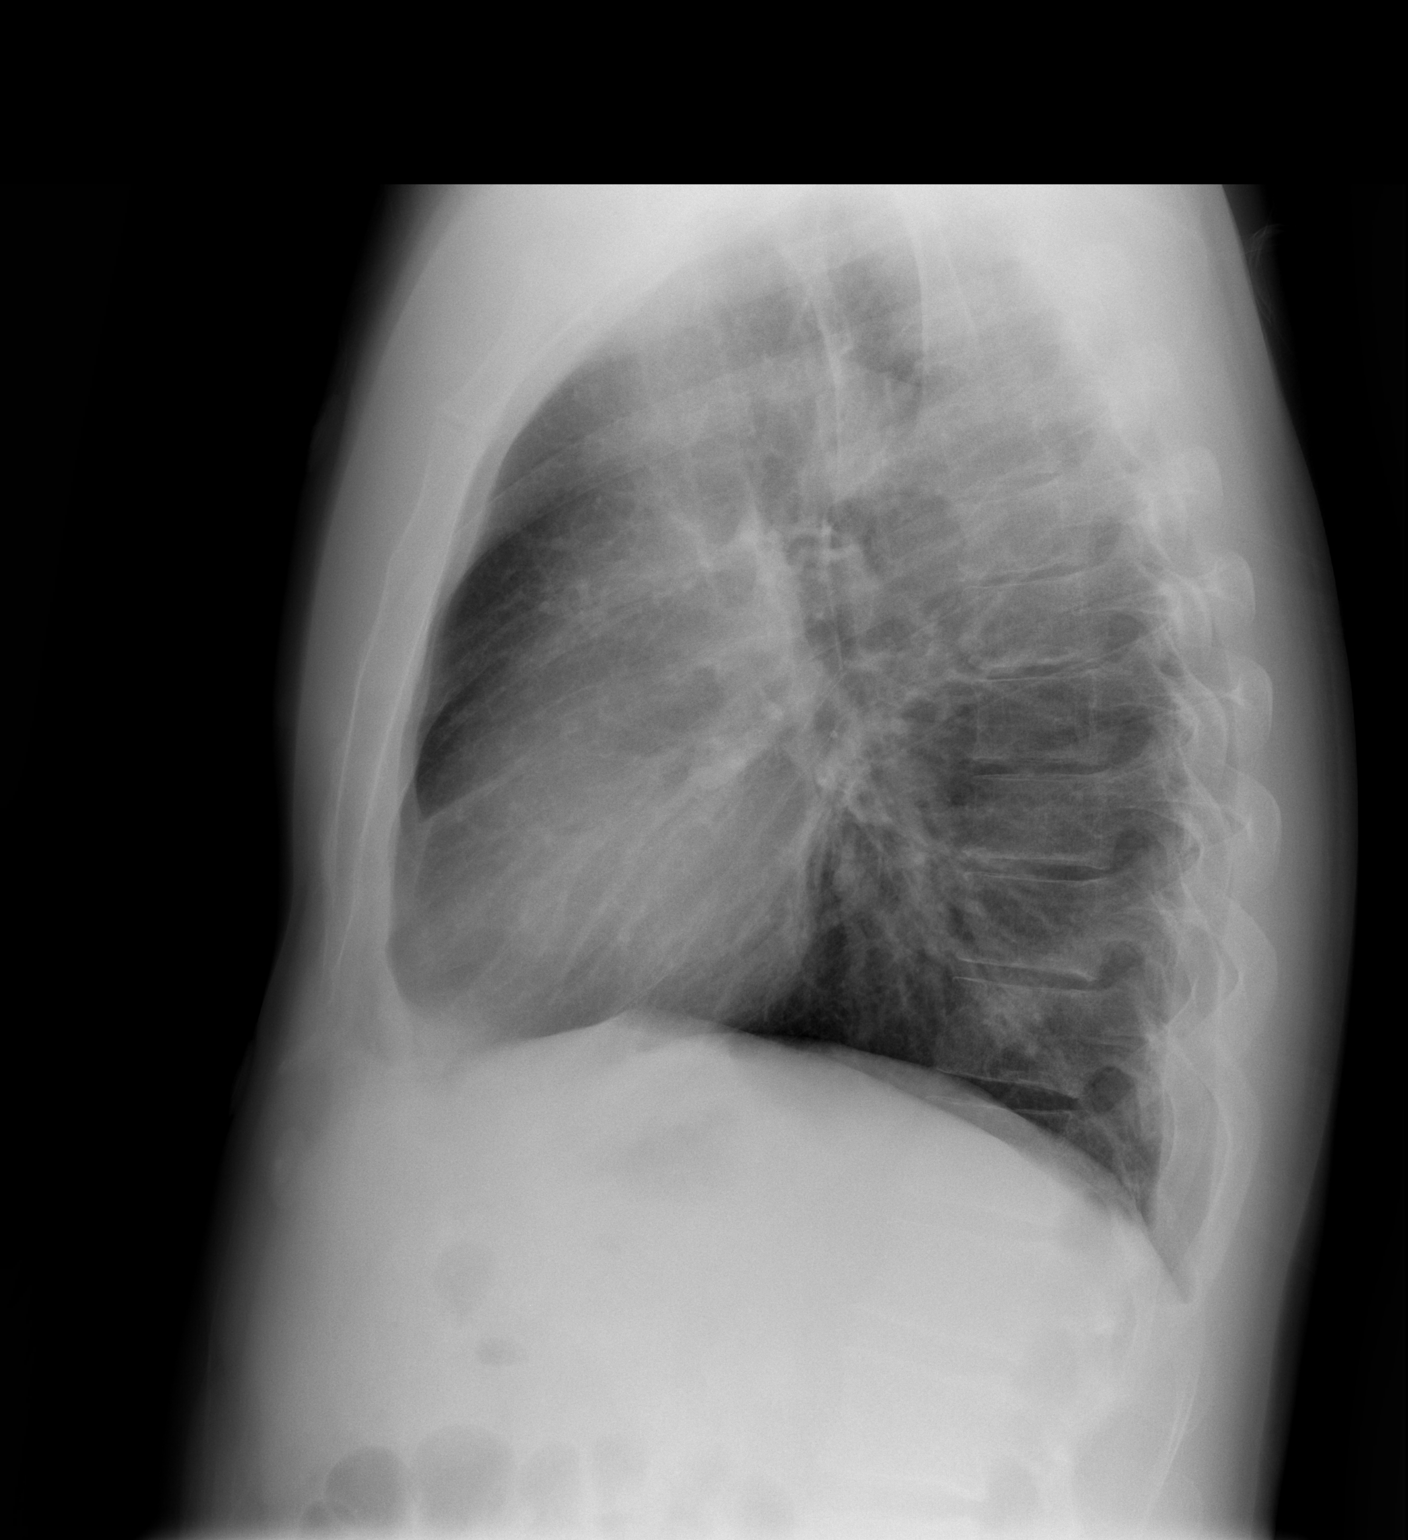

[2 of 2 positions shown; findings below may reference images not displayed]

FINDINGS: Normal heart size, mediastinal contours, and pulmonary vascularity.

Lungs clear.

No pleural effusion or pneumothorax.

Bones unremarkable.
IMPRESSION: Normal exam.

## 2018-12-27 ENCOUNTER — Ambulatory Visit (INDEPENDENT_AMBULATORY_CARE_PROVIDER_SITE_OTHER): Payer: 59 | Admitting: Family Medicine

## 2018-12-27 ENCOUNTER — Encounter: Payer: Self-pay | Admitting: Family Medicine

## 2018-12-27 DIAGNOSIS — F411 Generalized anxiety disorder: Secondary | ICD-10-CM | POA: Diagnosis not present

## 2018-12-27 MED ORDER — ESCITALOPRAM OXALATE 10 MG PO TABS: 10.0000 mg | ORAL_TABLET | Freq: Every day | ORAL | 3 refills | Status: DC

## 2018-12-27 NOTE — Progress Notes (Signed)
I have discussed the procedure for the virtual visit with the patient who has given consent to proceed with assessment and treatment.   Jessica L Brodmerkel, CMA     

## 2018-12-27 NOTE — Progress Notes (Signed)
Virtual Visit via Video   I connected with patient on 12/27/18 at  9:40 AM EDT by a video enabled telemedicine application and verified that I am speaking with the correct person using two identifiers.  Location patient: Home Location provider: [facility], Office Persons participating in the virtual visit: Patient, Provider, [place] (Jess B)  I discussed the limitations of evaluation and management by telemedicine and the availability of in person appointments. The patient expressed understanding and agreed to proceed.  Subjective:   HPI:   Anxiety- pt has hx of this.  'there's a lot of stress due to my job'.  Wife indicates he 'stays keyed up all the time'.  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  'sometimes the light at the end of the tunnel gets very very small'.  Pt reports this has been 'ongoing but building'.  Increased irritability.  Sleeping w/o difficulty.  Pt was previously on Lexapro.  ROS:   See pertinent positives and negatives per HPI.  Patient Active Problem List   Diagnosis Date Noted  . Obesity (BMI 30-39.9) 10/13/2018  . Physical exam 10/11/2017  . Allergic rhinoconjunctivitis 07/01/2016  . Allergy-induced asthma 06/02/2016  . Anxiety state 12/12/2015  . Memory loss 12/12/2015  . Dysphagia, pharyngoesophageal 07/18/2014  . Left hip pain 11/24/2013  . Fungal infection of foot 07/20/2011  . HTN (hypertension) 06/19/2011  . Urinary frequency 06/19/2011  . SHOULDER, PAIN 03/20/2009  . PARESTHESIA, HANDS 12/11/2008    Social History   Tobacco Use  . Smoking status: Never Smoker  . Smokeless tobacco: Never Used  Substance Use Topics  . Alcohol use: Yes    Alcohol/week: 0.0 standard drinks    Comment: occasional    Current Outpatient Medications:  .  albuterol (PROVENTIL HFA;VENTOLIN HFA) 108 (90 Base) MCG/ACT inhaler, Inhale 2 puffs into the lungs every 6 (six) hours as needed for wheezing or shortness of breath., Disp: 1 Inhaler,  Rfl: 6 .  Ascorbic Acid (VITAMIN C) 1000 MG tablet, Take 1,000 mg by mouth daily., Disp: , Rfl:  .  azelastine (ASTELIN) 0.1 % nasal spray, Place 2 sprays into both nostrils 2 (two) times daily. Use in each nostril as directed, Disp: 30 mL, Rfl: 5 .  beclomethasone (QVAR) 80 MCG/ACT inhaler, Inhale 2 puffs into the lungs 2 (two) times daily., Disp: 1 Inhaler, Rfl: 6 .  calcium carbonate (OSCAL) 1500 (600 Ca) MG TABS tablet, Take 600 mg of elemental calcium by mouth daily with breakfast., Disp: , Rfl:  .  cetirizine-pseudoephedrine (ZYRTEC-D ALLERGY & CONGESTION) 5-120 MG tablet, Take 1 tablet 2 (two) times daily by mouth., Disp: 60 tablet, Rfl: 1 .  Cholecalciferol (VITAMIN D) 2000 units CAPS, Take 1 capsule by mouth daily., Disp: , Rfl:  .  doxycycline (VIBRAMYCIN) 100 MG capsule, Take 1 capsule (100 mg total) by mouth 2 (two) times daily., Disp: 14 capsule, Rfl: 0 .  fluticasone (FLONASE) 50 MCG/ACT nasal spray, Place 2 sprays at bedtime into both nostrils., Disp: 16 g, Rfl: prn .  Fluticasone-Salmeterol (ADVAIR) 250-50 MCG/DOSE AEPB, Inhale 1 puff into the lungs 2 (two) times a day., Disp: 60 each, Rfl: 0 .  magnesium gluconate (MAGONATE) 500 MG tablet, Take 500 mg by mouth 2 (two) times daily., Disp: , Rfl:  .  montelukast (SINGULAIR) 10 MG tablet, Take 1 tablet (10 mg total) by mouth at bedtime., Disp: 90 tablet, Rfl: 1 .  mupirocin ointment (BACTROBAN) 2 %, , Disp: , Rfl: 0 .  Omega-3 Fatty Acids (OMEGA-3 FISH OIL) 1200 MG CAPS, Take 1 capsule by mouth daily., Disp: , Rfl:  .  OVER THE COUNTER MEDICATION, BEET ROOT 1 QD, Disp: , Rfl:  .  oxybutynin (DITROPAN-XL) 10 MG 24 hr tablet, Take 10 mg by mouth daily., Disp: , Rfl:  .  pantoprazole (PROTONIX) 20 MG tablet, Take 1 tablet (20 mg total) by mouth daily., Disp: 30 tablet, Rfl: 0 .  tamsulosin (FLOMAX) 0.4 MG CAPS capsule, Take 0.4 mg daily by mouth. , Disp: , Rfl:  .  vitamin k 100 MCG tablet, Take 100 mcg by mouth daily., Disp: , Rfl:    Allergies  Allergen Reactions  . Codeine Other (See Comments)    REACTION: skin irritation, pains, tenderness to touch  . Other Other (See Comments)    Narcotics-prefers not to take meds    Objective:   There were no vitals taken for this visit. AAOx3, NAD NCAT, EOMI No obvious CN deficits Coloring WNL Pt is able to speak clearly, coherently without shortness of breath or increased work of breathing.  Thought process is linear.  Mood is appropriate.  Assessment and Plan:   Anxiety- deteriorated.  pt has hx of this.  sxs have been worsening over time.  At this point, he is unable to relax.  Very distracted.  Constantly working.  Doesn't take any time for himself.  Increased irritability.  Discussed the need for him to take some time for himself every day.  Also discussed restarting SSRI.  Pt was on Lexapro in the past but I'm not sure if he ever actually took it (pt doesn't remember).  Will follow closely.  Annye Asa, MD 12/27/2018

## 2019-01-11 ENCOUNTER — Ambulatory Visit: Payer: 59 | Admitting: Family Medicine

## 2019-02-01 ENCOUNTER — Ambulatory Visit: Payer: 59 | Admitting: Allergy

## 2019-02-01 ENCOUNTER — Encounter: Payer: Self-pay | Admitting: Allergy

## 2019-02-01 ENCOUNTER — Other Ambulatory Visit: Payer: Self-pay

## 2019-02-01 VITALS — BP 130/90 | HR 90 | Temp 98.4°F | Resp 16 | Ht 69.0 in | Wt 199.2 lb

## 2019-02-01 DIAGNOSIS — J3089 Other allergic rhinitis: Secondary | ICD-10-CM

## 2019-02-01 DIAGNOSIS — J4540 Moderate persistent asthma, uncomplicated: Secondary | ICD-10-CM | POA: Diagnosis not present

## 2019-02-01 DIAGNOSIS — H1013 Acute atopic conjunctivitis, bilateral: Secondary | ICD-10-CM | POA: Diagnosis not present

## 2019-02-01 MED ORDER — CARBINOXAMINE MALEATE 6 MG PO TABS: 6.0000 mg | ORAL_TABLET | Freq: Two times a day (BID) | ORAL | 0 refills | Status: DC

## 2019-02-01 MED ORDER — XHANCE 93 MCG/ACT NA EXHU: 2.0000 | INHALANT_SUSPENSION | Freq: Two times a day (BID) | NASAL | 1 refills | Status: DC

## 2019-02-01 MED ORDER — FLUTICASONE-SALMETEROL 250-50 MCG/DOSE IN AEPB: 1.0000 | INHALATION_SPRAY | Freq: Two times a day (BID) | RESPIRATORY_TRACT | 0 refills | Status: DC

## 2019-02-01 MED ORDER — PAZEO 0.7 % OP SOLN: 1.0000 [drp] | Freq: Every day | OPHTHALMIC | 2 refills | Status: DC | PRN

## 2019-02-01 MED ORDER — IPRATROPIUM BROMIDE 0.06 % NA SOLN: 2.0000 | Freq: Four times a day (QID) | NASAL | 12 refills | Status: DC

## 2019-02-01 NOTE — Patient Instructions (Addendum)
Allergic Rhinoconjunctivitis - Continue avoidance measures for tree pollens, weed pollens, mold, dust mites, cat, dog.   - Continue Singulair 10mg  daily - Try Ryvent 6mg  twice a day.  This is an prescription based antihistamine that will replace Zyrtec and benadryl use.  Provided with samples.   - Continue nasal saline rinses (Neti Pot) prior to use of medicated nasal sprays to help flush/clean the nose and sinuses and then use your medicated nasal sprays. - Will have you try Xhance nasal spray device.  This device allows for deeper deposition of the medication into your sinuses for improved control of nasal congestion.  Xhance contains Fluticasone in a stronger strength than Flonase.  Use Xhance 2 sprays each nostril 1-2 times a day.   Hold Flonase or Nasacort while using Xhance.   Provided with sample device.   - For runny nose/drainage use nasal Atrovent 2 sprays each nostril up to 3-4 times a day as needed - Use Pazeo 1 drop each eye as needed for itchy/watery/red eyes - allergen immunotherapy discussed today including protocol, benefits and risk.  Informational handout provided.  If interested in this therapuetic option you can check with your insurance carrier for coverage.  Let us know if you would like to proceed with this option.  Allergy shots need to be performed in a medical office.    Moderate persistent asthma - Resume use of Advair 250-50 1 puff twice a day.   Use everyday regardless if you are sick or well.   - Singulair as above - Have access to albuterol inhaler 2 puffs every 4-6 hours as needed for cough/wheeze/shortness of breath/chest tightness.  May use 15-20 minutes prior to activity.   Monitor frequency of use.   - You are a candidate for asthma biologic injectable medications if we need to step up your therapy if Advair becomes less effective  Asthma control goals:   Full participation in all desired activities (may need albuterol before activity)  Albuterol use two time  or less a week on average (not counting use with activity)  Cough interfering with sleep two time or less a month  Oral steroids no more than once a year  No hospitalizations  Wear protective nose/mouth covering when dealing with dust and other products that can be airborne.   Follow-up 3-4 months or sooner if needed

## 2019-02-01 NOTE — Progress Notes (Signed)
Follow-up Note  RE: Alexander Hernandez MRN: [id number] DOB: 02-22-1976 Date of Office Visit: 02/01/2019   History of present illness: Alexander Hernandez is a 43 y.o. male presenting today for follow-up of asthma and allergic rhinitis with conjunctivitis.  He was last seen in the office on 07/01/16 by myself for initial visit.    He states he has been getting worse over the past year in regards to his allergies and asthma.  He states this past Feb/March he has had worsening difficulty breathing, SOB, cough, wheezing. He reports he was nighttime awakenings.  He did required a course of prednisone in April for his symptoms.  He was on Qvar but was recently changed since April to Advair diskus and states he was taking 1 puffs twice a day.  However he would take for a week or so at a time and feel better and then stop.  He states when he was on the Advair he would not need to use his albuterol.  However when he is off Advair he may need albuterol multiple times a day.  He is not out of Advair as he states he was only prescribed 1 inhaler without refills.     He also states his allergies have been terrible especially with all the rain from the spring.  He states he will get into bouts of sneezing and runny nose.  He also states that his congestion is bad to the point that he will need to use Afrin for relief.  He knows he should not use Afrin consistently.  He states he has been using either Flonase or Nasacort daily without much benefit.  He takes singulair daily.  He has been using Zyrtec-D and benadryl currently but states the benadryl just makes him sleepy.  He has an eyedrop that he can use when he has red, itchy eyes.  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  All of this he states worsens his allergy symptoms.     Review of systems: Review of Systems  Constitutional: Negative for chills, fever and malaise/fatigue.  HENT: Positive for congestion. Negative  for ear discharge, nosebleeds, sinus pain and sore throat.   Eyes: Positive for redness. Negative for blurred vision, pain and discharge.  Respiratory: Positive for cough, shortness of breath and wheezing.   Cardiovascular: Negative for chest pain.  Gastrointestinal: Negative for abdominal pain, constipation, diarrhea, heartburn, nausea and vomiting.  Musculoskeletal: Negative for joint pain and myalgias.  Skin: Negative for itching and rash.  Neurological: Negative for headaches.    All other systems negative unless noted above in HPI  Past medical/social/surgical/family history have been reviewed and are unchanged unless specifically indicated below.  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 If you have any questions, ask your nurse or doctor.        STOP taking these medications   doxycycline 100 MG capsule Commonly known as: VIBRAMYCIN Stopped by: Shaylar Charmian Muff, MD     TAKE these medications   albuterol 108 (90 Base) MCG/ACT inhaler Commonly known as:  VENTOLIN HFA Inhale 2 puffs into the lungs every 6 (six) hours as needed for wheezing or shortness of breath.   azelastine 0.1 % nasal spray Commonly known as: Astelin Place 2 sprays into both nostrils 2 (two) times daily. Use in each nostril as directed   beclomethasone 80 MCG/ACT inhaler Commonly known as: Qvar Inhale 2 puffs into the lungs 2 (two) times daily.   calcium carbonate 1500 (600 Ca) MG Tabs tablet Commonly known as: OSCAL Take 600 mg of elemental calcium by mouth daily with breakfast.   cetirizine-pseudoephedrine 5-120 MG tablet Commonly known as: ZyrTEC-D Allergy & Congestion Take 1 tablet 2 (two) times daily by  mouth.   escitalopram 10 MG tablet Commonly known as: Lexapro Take 1 tablet (10 mg total) by mouth daily.   fluticasone 50 MCG/ACT nasal spray Commonly known as: FLONASE Place 2 sprays at bedtime into both nostrils.   Fluticasone-Salmeterol 250-50 MCG/DOSE Aepb Commonly known as: ADVAIR Inhale 1 puff into the lungs 2 (two) times a day.   magnesium gluconate 500 MG tablet Commonly known as: MAGONATE Take 500 mg by mouth 2 (two) times daily.   montelukast 10 MG tablet Commonly known as: SINGULAIR Take 1 tablet (10 mg total) by mouth at bedtime.   mupirocin ointment 2 % Commonly known as: BACTROBAN   Omega-3 Fish Oil 1200 MG Caps Take 1 capsule by mouth daily.   OVER THE COUNTER MEDICATION BEET ROOT 1 QD   oxybutynin 10 MG 24 hr tablet Commonly known as: DITROPAN-XL Take 10 mg by mouth daily.   pantoprazole 20 MG tablet Commonly known as: PROTONIX Take 1 tablet (20 mg total) by mouth daily.   tamsulosin 0.4 MG Caps capsule Commonly known as: FLOMAX Take 0.4 mg daily by mouth.   vitamin C 1000 MG tablet Take 1,000 mg by mouth daily.   Vitamin D 50 MCG (2000 UT) Caps Take 1 capsule by mouth daily.   vitamin k 100 MCG tablet Take 100 mcg by mouth daily.       Known medication allergies: Allergies  Allergen Reactions  . Codeine Other (See Comments)    REACTION: skin irritation, pains, tenderness to touch  . Other Other (See Comments)    Narcotics-prefers not to take meds     Physical examination: Blood pressure 130/90, pulse 90, temperature 98.4 F (36.9 C), resp. rate 16, height 5\' 9"  (1.753 m), weight 199 lb 3.2 oz (90.4 kg), SpO2 97 %.  General: Alert, interactive, in no acute distress. HEENT: PERRLA, TMs pearly gray, turbinates moderately edematous with clear discharge, post-pharynx non erythematous. Neck: Supple without lymphadenopathy. Lungs: Clear to auscultation without wheezing, rhonchi or rales. {no increased work of breathing. CV: Normal  S1, S2 without murmurs. Abdomen: Nondistended, nontender. Skin: Warm and dry, without lesions or rashes. Extremities:  No clubbing, cyanosis or edema. Neuro:   Grossly intact.  Diagnositics/Labs:  Spirometry: FEV1: 3.6L 91%, FVC: 4.93L 100%, ratio consistent with nonobstructive pattern  Assessment and plan:   Allergic Rhinoconjunctivitis - Continue avoidance measures for tree pollens, weed pollens, mold, dust mites, cat, dog.   - Continue Singulair 10mg  daily - Try Ryvent 6mg  twice a day.  This is an prescription based antihistamine that will replace Zyrtec and benadryl use.  Provided with samples.   - Continue nasal saline rinses (Neti Pot) prior to use of medicated nasal sprays to help flush/clean the nose and sinuses and then use your medicated nasal sprays. - Will have you try Xhance nasal spray device.  This  device allows for deeper deposition of the medication into your sinuses for improved control of nasal congestion.  Xhance contains Fluticasone in a stronger strength than Flonase.  Use Xhance 2 sprays each nostril 1-2 times a day.   Hold Flonase or Nasacort while using Xhance.   Provided with sample device.   - For runny nose/drainage use nasal Atrovent 2 sprays each nostril up to 3-4 times a day as needed - Use Pazeo 1 drop each eye as needed for itchy/watery/red eyes - allergen immunotherapy discussed today including protocol, benefits and risk.  Informational handout provided.  If interested in this therapuetic option you can check with your insurance carrier for coverage.  Let us know if you would like to proceed with this option.  Allergy shots need to be performed in a medical office.    Moderate persistent asthma - Resume use of Advair 250-50 1 puff twice a day.   Use everyday regardless if you are sick or well.   - Singulair as above - Have access to albuterol inhaler 2 puffs every 4-6 hours as needed for cough/wheeze/shortness of breath/chest tightness.  May use 15-20  minutes prior to activity.   Monitor frequency of use.   - You are a candidate for asthma biologic injectable medications if we need to step up your therapy if Advair becomes less effective  Asthma control goals:   Full participation in all desired activities (may need albuterol before activity)  Albuterol use two time or less a week on average (not counting use with activity)  Cough interfering with sleep two time or less a month  Oral steroids no more than once a year  No hospitalizations  Wear protective nose/mouth covering when dealing with dust and other products that can be airborne.   Follow-up 3-4 months or sooner if needed  I appreciate the opportunity to take part in Chaim's care. Please do not hesitate to contact me with questions.  Sincerely,   Prudy Feeler, MD Allergy/Immunology [facility]

## 2019-02-09 ENCOUNTER — Telehealth: Payer: Self-pay

## 2019-02-09 DIAGNOSIS — H1010 Acute atopic conjunctivitis, unspecified eye: Secondary | ICD-10-CM

## 2019-02-09 DIAGNOSIS — J309 Allergic rhinitis, unspecified: Secondary | ICD-10-CM

## 2019-02-09 MED ORDER — FLUTICASONE PROPIONATE 50 MCG/ACT NA SUSP: 1.0000 | Freq: Every day | NASAL | 5 refills | Status: DC

## 2019-02-09 MED ORDER — AZELASTINE HCL 0.1 % NA SOLN: 2.0000 | Freq: Two times a day (BID) | NASAL | 5 refills | Status: DC

## 2019-02-09 NOTE — Telephone Encounter (Signed)
PA for Truett Perna was denied through patient insurance Rx will need to be sent in separately

## 2019-02-09 NOTE — Telephone Encounter (Signed)
-   PA Case ID: [id number] - Rx #: [id number] Xhance 93 mcg/act

## 2019-02-09 NOTE — Addendum Note (Signed)
Addended by: Valere Dross on: 02/09/2019 03:36 PM   Modules accepted: Orders

## 2019-03-06 ENCOUNTER — Other Ambulatory Visit: Payer: Self-pay | Admitting: Allergy

## 2019-04-07 ENCOUNTER — Other Ambulatory Visit: Payer: Self-pay | Admitting: Allergy

## 2019-04-12 IMAGING — CT CT BIOPSY
4 of 10 series · 8 of 32 positions shown, 11 images · non-contrast
Comparison: none

CLINICAL DATA: Left hip pain.  Status post left hip replacement.

[Series 3: needle -guided injection · axial · 0.37mm/px · z∈[-1026,-984]mm · 2 of 63 slices shown, 5 images (1 of 2)]
[im 21/63  soft-tissue]
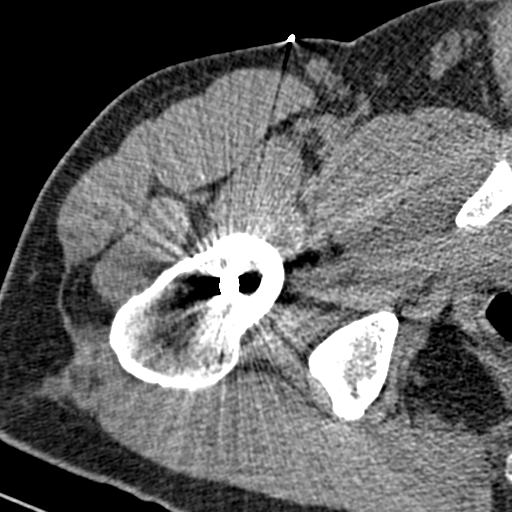
[im 21/63  lung]
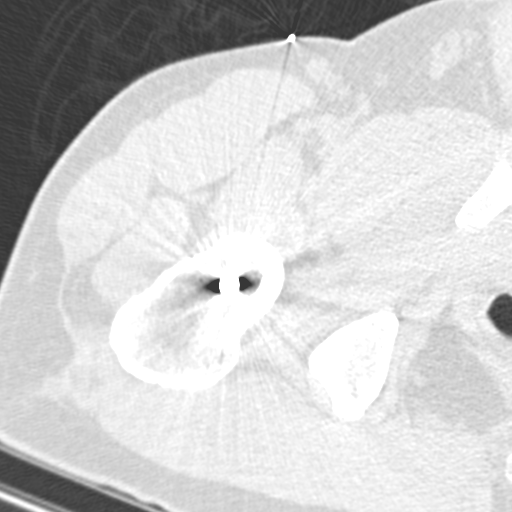
[im 21/63  bone]
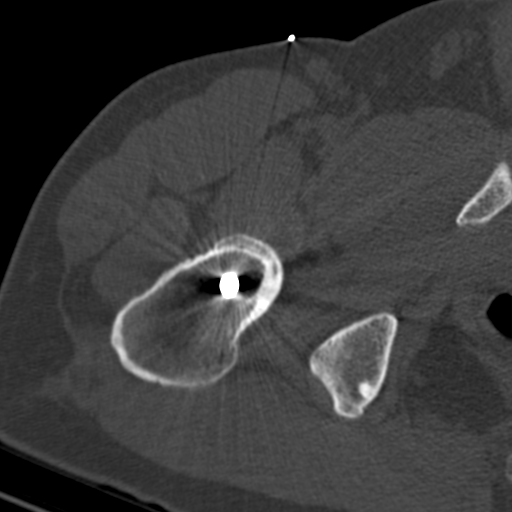
[im 42/63  soft-tissue]
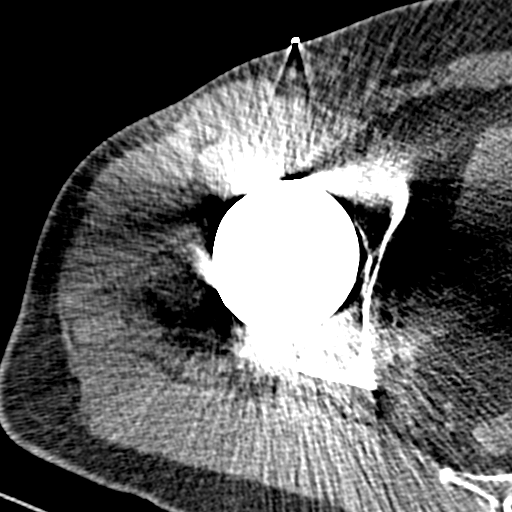
[im 42/63  lung]
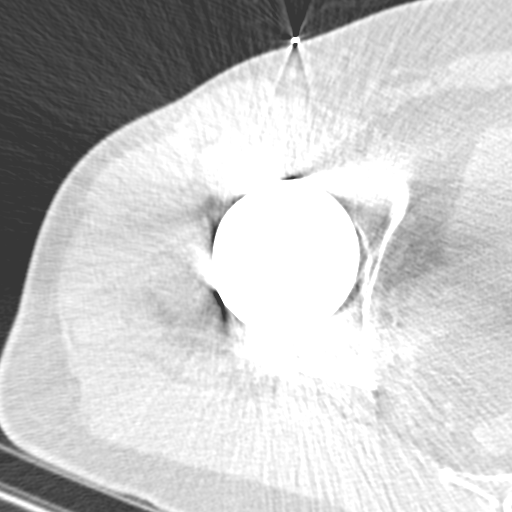

[Series 4: needle -guided injection (person_name) · axial · 0.37mm/px · z∈[-1026,-984]mm · 2 of 63 slices shown (1 of 2)]
[im 21/63  soft-tissue]
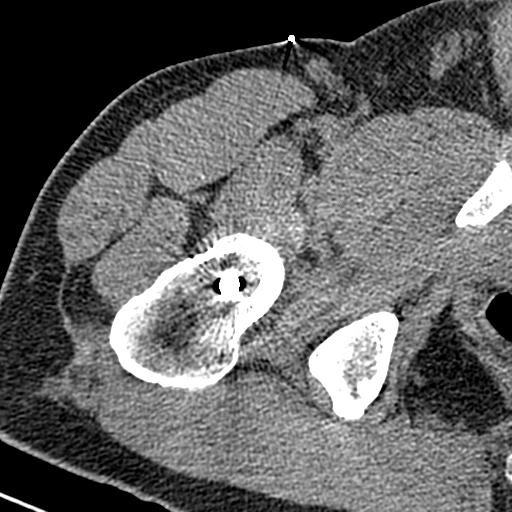
[im 42/63  soft-tissue]
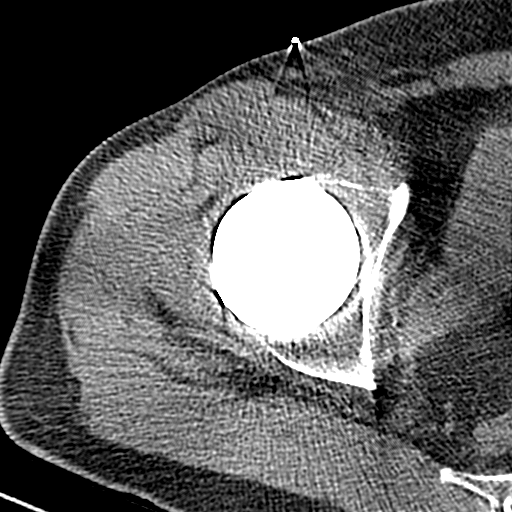

[Series 5: needle -guided injection (person_name) · axial · 0.37mm/px · z∈[-1026,-984]mm · 2 of 63 slices shown (2 of 2)]
[im 21/63  soft-tissue]
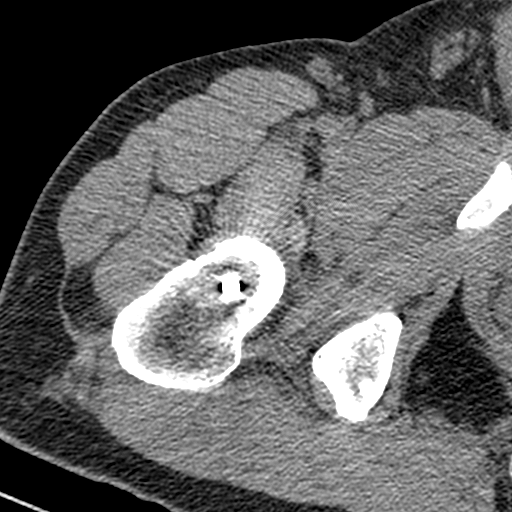
[im 42/63  soft-tissue]
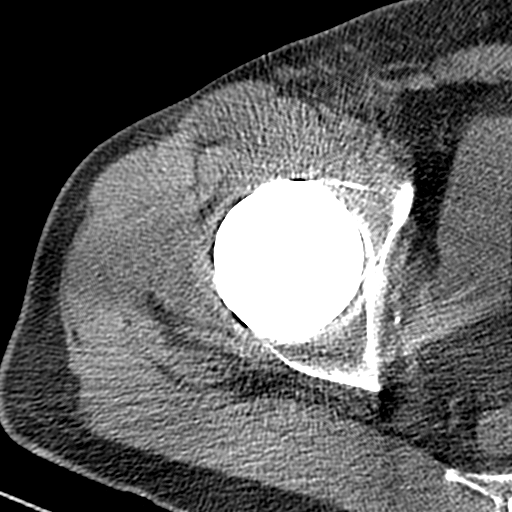

[Series 6: needle -guided injection · axial · 0.37mm/px · z∈[-1026,-984]mm · 2 of 63 slices shown (2 of 2)]
[im 21/63  soft-tissue]
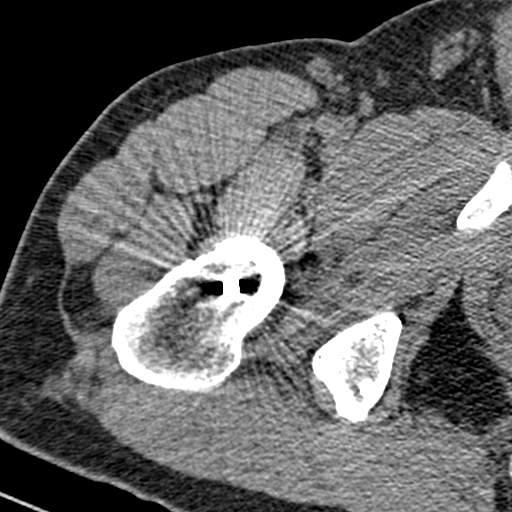
[im 42/63  soft-tissue]
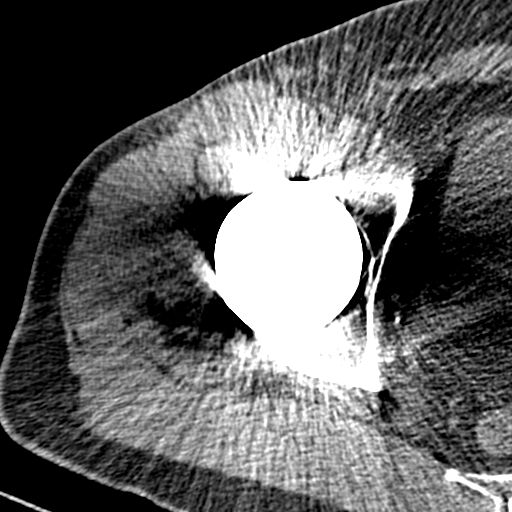

[8 of 32 positions shown; findings below may reference images not displayed]

EXAM:
CT GUIDED LEFT ILIOPSOAS BURSA INJECTION

PROCEDURE:
After a thorough discussion of risks and benefits of the procedure,
including bleeding, infection, injury to nerves, blood vessels, and
adjacent structures, verbal and written consent was obtained. The
patient was placed supine on the CT table and localization was
performed over the left hip. Target site marked using CT guidance.
The skin was prepped and draped in the usual sterile fashion using
Betadine soap.

After local anesthesia with 1% lidocaine without epinephrine and
subsequent deep anesthesia, a 3.5 inch 22 gauge spinal needle was
advanced to the left iliopsoas tendon approximately 3 cm proximal to
its insertion on the lesser trochanter under intermittent CT
guidance. Injection of a small amount of Isovue-M 200 demonstrated
contrast surrounding the iliopsoas tendon and spreading superiorly
along the tendon consistent with spread in the bursa. Subsequently,
120 mg of Depo-Medrol and 3 mL of 0.5% bupivacaine were injected.
The needle was removed and a sterile dressing applied.

No complications were observed.
IMPRESSION: Successful CT-guided left iliopsoas bursa injection.

## 2019-05-04 ENCOUNTER — Ambulatory Visit: Payer: 59 | Admitting: Allergy

## 2019-05-11 ENCOUNTER — Ambulatory Visit: Payer: 59 | Admitting: Allergy

## 2019-05-19 ENCOUNTER — Other Ambulatory Visit: Payer: Self-pay | Admitting: Family Medicine

## 2019-07-05 ENCOUNTER — Other Ambulatory Visit: Payer: Self-pay | Admitting: Family Medicine

## 2019-10-18 ENCOUNTER — Ambulatory Visit (INDEPENDENT_AMBULATORY_CARE_PROVIDER_SITE_OTHER): Payer: 59 | Admitting: Family Medicine

## 2019-10-18 ENCOUNTER — Encounter: Payer: Self-pay | Admitting: Family Medicine

## 2019-10-18 ENCOUNTER — Other Ambulatory Visit: Payer: Self-pay

## 2019-10-18 VITALS — BP 126/89 | HR 87 | Temp 98.1°F | Resp 16 | Ht 69.0 in | Wt 205.2 lb

## 2019-10-18 DIAGNOSIS — E559 Vitamin D deficiency, unspecified: Secondary | ICD-10-CM | POA: Diagnosis not present

## 2019-10-18 DIAGNOSIS — F411 Generalized anxiety disorder: Secondary | ICD-10-CM

## 2019-10-18 DIAGNOSIS — Z125 Encounter for screening for malignant neoplasm of prostate: Secondary | ICD-10-CM

## 2019-10-18 DIAGNOSIS — R6882 Decreased libido: Secondary | ICD-10-CM | POA: Diagnosis not present

## 2019-10-18 DIAGNOSIS — Z0000 Encounter for general adult medical examination without abnormal findings: Secondary | ICD-10-CM

## 2019-10-18 DIAGNOSIS — E669 Obesity, unspecified: Secondary | ICD-10-CM | POA: Diagnosis not present

## 2019-10-18 MED ORDER — BUPROPION HCL ER (XL) 150 MG PO TB24: 150.0000 mg | ORAL_TABLET | Freq: Every day | ORAL | 3 refills | Status: DC

## 2019-10-18 NOTE — Progress Notes (Signed)
   Subjective:    Patient ID: Alexander Hernandez, male    DOB: 06/15/1976, 44 y.o.   MRN: [id number]  HPI CPE- UTD on Tdap.  Pt has gained 6 lbs.  + decreased sex drive and fatigue- pt is worried about low testosterone.  He reports + weight gain, decreased muscle mass but he has stopped working out.   Review of Systems Patient reports no vision/hearing changes, anorexia, fever ,adenopathy, persistant/recurrent hoarseness, swallowing issues, chest pain, palpitations, edema, persistant/recurrent cough, hemoptysis, dyspnea (rest,exertional, paroxysmal nocturnal), gastrointestinal  bleeding (melena, rectal bleeding), abdominal pain, excessive heart burn, GU symptoms (dysuria, hematuria, voiding/incontinence issues) syncope, focal weakness, memory loss, numbness & tingling, skin/hair/nail changes, depression, anxiety, abnormal bruising/bleeding, musculoskeletal symptoms/signs.   This visit occurred during the SARS-CoV-2 public health emergency.  Safety protocols were in place, including screening questions prior to the visit, additional usage of staff PPE, and extensive cleaning of exam room while observing appropriate contact time as indicated for disinfecting solutions.       Objective:   Physical Exam General Appearance:    Alert, cooperative, no distress, appears stated age  Head:    Normocephalic, without obvious abnormality, atraumatic  Eyes:    PERRL, conjunctiva/corneas clear, EOM's intact, fundi    benign, both eyes       Ears:    Normal TM's and external ear canals, both ears  Nose:   Deferred due to COVID  Throat:   Neck:   Supple, symmetrical, trachea midline, no adenopathy;       thyroid:  No enlargement/tenderness/nodules  Back:     Symmetric, no curvature, ROM normal, no CVA tenderness  Lungs:     Clear to auscultation bilaterally, respirations unlabored  Chest wall:    No tenderness or deformity  Heart:    Regular rate and rhythm, S1 and S2 normal, no murmur, rub   or gallop    Abdomen:     Soft, non-tender, bowel sounds active all four quadrants,    no masses, no organomegaly  Genitalia:    Deferred to urology  Rectal:    Extremities:   Extremities normal, atraumatic, no cyanosis or edema  Pulses:   2+ and symmetric all extremities  Skin:   Skin color, texture, turgor normal, no rashes or lesions  Lymph nodes:   Cervical, supraclavicular, and axillary nodes normal  Neurologic:   CNII-XII intact. Normal strength, sensation and reflexes      throughout          Assessment & Plan:  Decreased libido- pt is concerned for possible low T given reduced muscle mass, fatigue, decreased libido.  Will check testosterone level and treat prn.  Anxiety- pt reports mood is better but libido has considerably decreased since starting medication.  Will switch Lexapro and start Wellbutrin in hopes of improving libido and energy level.

## 2019-10-18 NOTE — Patient Instructions (Addendum)
Follow up in 4-6 weeks to recheck mood We'll notify you of your lab results and make any changes if needed Continue to work on healthy diet and regular exercise- you can do it! STOP the Lexapro START the Wellbutrin Call Dr Juel Burrow office to schedule a derm appt [phone] Call with any questions or concerns Stay Safe!  Stay Healthy!

## 2019-10-18 NOTE — Assessment & Plan Note (Signed)
Pt has gained weight since last visit.  Encouraged healthy diet and regular exercise.  Check labs to risk stratify.

## 2019-10-18 NOTE — Assessment & Plan Note (Signed)
Pt has hx of this.  Check labs.  Replete prn. 

## 2019-10-19 ENCOUNTER — Other Ambulatory Visit: Payer: Self-pay

## 2019-10-19 ENCOUNTER — Encounter: Payer: Self-pay | Admitting: General Practice

## 2019-10-19 DIAGNOSIS — R7989 Other specified abnormal findings of blood chemistry: Secondary | ICD-10-CM

## 2019-10-19 LAB — BASIC METABOLIC PANEL
BUN: 15 mg/dL (ref 6–23)
CO2: 28 mEq/L (ref 19–32)
Calcium: 9.7 mg/dL (ref 8.4–10.5)
Chloride: 105 mEq/L (ref 96–112)
Creatinine, Ser: 1.08 mg/dL (ref 0.40–1.50)
GFR: 73.62 mL/min (ref >60.00)
Glucose, Bld: 88 mg/dL (ref 70–99)
Potassium: 4 mEq/L (ref 3.5–5.1)
Sodium: 140 mEq/L (ref 135–145)

## 2019-10-19 LAB — TESTOSTERONE TOTAL,FREE,BIO, MALES
Albumin: 4.5 g/dL (ref 3.6–5.1)
Sex Hormone Binding: 15 nmol/L (ref 10–50)
Testosterone: 210 ng/dL — ABNORMAL LOW (ref 250–827)

## 2019-10-19 LAB — CBC WITH DIFFERENTIAL/PLATELET
Basophils Absolute: 0.1 10*3/uL (ref 0.0–0.1)
Basophils Relative: 1 % (ref 0.0–3.0)
Eosinophils Absolute: 0.7 10*3/uL (ref 0.0–0.7)
Eosinophils Relative: 9.6 % — ABNORMAL HIGH (ref 0.0–5.0)
HCT: 45.3 % (ref 39.0–52.0)
Hemoglobin: 15.5 g/dL (ref 13.0–17.0)
Lymphocytes Relative: 20 % (ref 12.0–46.0)
Lymphs Abs: 1.5 10*3/uL (ref 0.7–4.0)
MCHC: 34.3 g/dL (ref 30.0–36.0)
MCV: 87.8 fl (ref 78.0–100.0)
Monocytes Absolute: 0.6 10*3/uL (ref 0.1–1.0)
Monocytes Relative: 8 % (ref 3.0–12.0)
Neutro Abs: 4.5 10*3/uL (ref 1.4–7.7)
Neutrophils Relative %: 61.4 % (ref 43.0–77.0)
Platelets: 234 10*3/uL (ref 150.0–400.0)
RBC: 5.16 Mil/uL (ref 4.22–5.81)
RDW: 13.1 % (ref 11.5–15.5)
WBC: 7.3 10*3/uL (ref 4.0–10.5)

## 2019-10-19 LAB — HEPATIC FUNCTION PANEL
ALT: 27 U/L (ref 0–53)
AST: 21 U/L (ref 0–37)
Albumin: 4.4 g/dL (ref 3.5–5.2)
Alkaline Phosphatase: 64 U/L (ref 39–117)
Bilirubin, Direct: 0.2 mg/dL (ref 0.0–0.3)
Total Bilirubin: 1 mg/dL (ref 0.2–1.2)
Total Protein: 7.1 g/dL (ref 6.0–8.3)

## 2019-10-19 LAB — LIPID PANEL
Cholesterol: 173 mg/dL (ref 0–200)
HDL: 44.8 mg/dL (ref >39.00)
LDL Cholesterol: 106 mg/dL — ABNORMAL HIGH (ref 0–99)
NonHDL: 128.18
Total CHOL/HDL Ratio: 4
Triglycerides: 109 mg/dL (ref 0.0–149.0)
VLDL: 21.8 mg/dL (ref 0.0–40.0)

## 2019-10-19 LAB — PSA: PSA: 0.44 ng/mL (ref 0.10–4.00)

## 2019-10-19 LAB — VITAMIN D 25 HYDROXY (VIT D DEFICIENCY, FRACTURES): VITD: 35.42 ng/mL (ref 30.00–100.00)

## 2019-10-19 LAB — TSH: TSH: 1.38 u[IU]/mL (ref 0.35–4.50)

## 2019-10-31 ENCOUNTER — Other Ambulatory Visit: Payer: Self-pay | Admitting: Family Medicine

## 2019-11-16 ENCOUNTER — Ambulatory Visit: Payer: 59 | Admitting: Family Medicine

## 2019-12-04 ENCOUNTER — Encounter: Payer: Self-pay | Admitting: Internal Medicine

## 2019-12-08 ENCOUNTER — Ambulatory Visit: Payer: 59 | Admitting: Internal Medicine
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
## 2019-12-12 ENCOUNTER — Ambulatory Visit: Payer: 59 | Admitting: Internal Medicine

## 2019-12-12 ENCOUNTER — Encounter: Payer: Self-pay | Admitting: Internal Medicine

## 2019-12-12 ENCOUNTER — Other Ambulatory Visit: Payer: Self-pay

## 2019-12-12 VITALS — BP 122/82 | HR 80 | Temp 98.4°F | Ht 69.0 in | Wt 202.8 lb

## 2019-12-12 DIAGNOSIS — R7989 Other specified abnormal findings of blood chemistry: Secondary | ICD-10-CM

## 2019-12-12 NOTE — Patient Instructions (Signed)
-   Please stop by the labs at your convenience ( no later then 8 AM , fasting )

## 2019-12-13 ENCOUNTER — Other Ambulatory Visit (INDEPENDENT_AMBULATORY_CARE_PROVIDER_SITE_OTHER): Payer: 59

## 2019-12-13 ENCOUNTER — Other Ambulatory Visit: Payer: 59

## 2019-12-13 DIAGNOSIS — R7989 Other specified abnormal findings of blood chemistry: Secondary | ICD-10-CM | POA: Diagnosis not present

## 2019-12-13 LAB — LUTEINIZING HORMONE: LH: 3.77 m[IU]/mL (ref 1.50–9.30)

## 2019-12-13 LAB — FOLLICLE STIMULATING HORMONE: FSH: 2.6 m[IU]/mL (ref 1.4–18.1)

## 2019-12-14 ENCOUNTER — Encounter: Payer: Self-pay | Admitting: Internal Medicine

## 2019-12-15 ENCOUNTER — Other Ambulatory Visit: Payer: 59

## 2019-12-15 LAB — PROLACTIN: Prolactin: 9.9 ng/mL (ref 2.0–18.0)

## 2019-12-15 LAB — TESTOSTERONE, TOTAL, LC/MS/MS: Testosterone, Total, LC-MS-MS: 302 ng/dL (ref 250–1100)

## 2020-01-26 ENCOUNTER — Other Ambulatory Visit: Payer: Self-pay | Admitting: Family Medicine

## 2020-02-20 ENCOUNTER — Other Ambulatory Visit: Payer: Self-pay | Admitting: Allergy

## 2020-02-22 ENCOUNTER — Other Ambulatory Visit: Payer: Self-pay | Admitting: Allergy

## 2020-03-08 ENCOUNTER — Other Ambulatory Visit: Payer: Self-pay | Admitting: Allergy

## 2020-03-08 DIAGNOSIS — J309 Allergic rhinitis, unspecified: Secondary | ICD-10-CM

## 2020-03-08 MED ORDER — AZELASTINE HCL 0.1 % NA SOLN: 2.0000 | Freq: Two times a day (BID) | NASAL | 1 refills | Status: DC

## 2020-03-08 MED ORDER — XHANCE 93 MCG/ACT NA EXHU: 2.0000 | INHALANT_SUSPENSION | Freq: Two times a day (BID) | NASAL | 1 refills | Status: DC

## 2020-03-08 NOTE — Telephone Encounter (Signed)
Patient is wanting to check on status of xhance, and advair diskus and astelin are in need for a refill.

## 2020-03-08 NOTE — Telephone Encounter (Signed)
Courtesy refills sent, appt scheduled

## 2020-03-21 ENCOUNTER — Other Ambulatory Visit: Payer: Self-pay | Admitting: Family Medicine

## 2020-04-17 ENCOUNTER — Encounter: Payer: Self-pay | Admitting: Allergy

## 2020-04-17 ENCOUNTER — Other Ambulatory Visit: Payer: Self-pay

## 2020-04-17 ENCOUNTER — Ambulatory Visit: Payer: 59 | Admitting: Allergy

## 2020-04-17 VITALS — BP 110/72 | HR 94 | Temp 98.0°F | Resp 16 | Ht 69.0 in | Wt 200.2 lb

## 2020-04-17 DIAGNOSIS — J3089 Other allergic rhinitis: Secondary | ICD-10-CM

## 2020-04-17 DIAGNOSIS — J4540 Moderate persistent asthma, uncomplicated: Secondary | ICD-10-CM | POA: Diagnosis not present

## 2020-04-17 DIAGNOSIS — H1013 Acute atopic conjunctivitis, bilateral: Secondary | ICD-10-CM | POA: Diagnosis not present

## 2020-04-17 NOTE — Progress Notes (Signed)
Follow-up Note  RE: Alexander Hernandez MRN: [id number] DOB: 1975/11/21 Date of Office Visit: 04/17/2020   History of present illness: Alexander Hernandez is a 44 y.o. male presenting today for follow-up of asthma and allergies.  He was last seen in the office on 02/01/2019 by myself.  He states he really wants to get off of the Advair.  He feels like it is contributing to weight gain.  He states he has been trying to slowly taper himself down and states he uses the Advair 250 mcg inhaler right now 1 puff every 2 days or so.  However he states if he goes 3 days and then he will typically develop symptoms of wheeze or cough or holding to use the rescue inhaler.  He continues to take Singulair daily.  He has not had any ED or urgent care visits for asthma exacerbations or any systemic steroid needs. With his allergies he states he will have days where he is quite symptomatic and other times have good days.  He states when his allergies are flaring up he will have a lot of sneezing and nasal drainage.  He states he takes the Zyrtec as needed.  He does remember taking RyVent after the last visit to see if this would be more effective and he does state that it did work better than the Zyrtec however he forgot to let us know that it worked better to provide prescription for him.  He also will use nasal saline rinses as needed.  He also does use XHANCE as well for help with nasal congestion control.  He has access to olopatadine based eyedrop if needed for itchy watery eyes.  He states he does know a lot of symptoms if he has to do yard work especially if it involves handling hay.  He states it is difficult to perform these tasks wearing a particulate filtered mask.  Review of systems: Review of Systems  Constitutional: Negative.   HENT:       See HPI  Eyes: Negative.   Respiratory:       See HPI  Cardiovascular: Negative.   Gastrointestinal: Negative.   Musculoskeletal: Negative.   Skin: Negative.     Neurological: Negative.     All other systems negative unless noted above in HPI  Past medical/social/surgical/family history have been reviewed and are unchanged unless specifically indicated below.  No changes  Medication List: Current Outpatient Medications  Medication Sig Dispense Refill  . Ascorbic Acid (VITAMIN C) 1000 MG tablet Take 1,000 mg by mouth daily.    [name] azelastine (ASTELIN) 0.1 % nasal spray Place 2 sprays into both nostrils 2 (two) times daily. 30 mL 1  . beclomethasone (QVAR) 80 MCG/ACT inhaler Inhale 2 puffs into the lungs 2 (two) times daily. 1 Inhaler 6  . buPROPion (WELLBUTRIN XL) 150 MG 24 hr tablet Take 1 tablet by mouth once daily 30 tablet 0  . cetirizine-pseudoephedrine (ZYRTEC-D ALLERGY & CONGESTION) 5-120 MG tablet Take 1 tablet 2 (two) times daily by mouth. 60 tablet 1  . fluticasone (FLONASE) 50 MCG/ACT nasal spray Place 1 spray into both nostrils daily. 16 g 5  . Fluticasone Propionate (XHANCE) 93 MCG/ACT EXHU Place 2 sprays into both nostrils in the morning and at bedtime. 16 mL 1  . ipratropium (ATROVENT) 0.06 % nasal spray Place 2 sprays into both nostrils 4 (four) times daily. 15 mL 12  . magnesium gluconate (MAGONATE) 500 MG tablet Take 500 mg by mouth 2 (two) times  daily.    . montelukast (SINGULAIR) 10 MG tablet TAKE 1 TABLET BY MOUTH AT BEDTIME 90 tablet 0  . mupirocin ointment (BACTROBAN) 2 %   0  . Olopatadine HCl (PAZEO) 0.7 % SOLN Apply 1 drop to eye daily as needed. 2.5 mL 2  . Omega-3 Fatty Acids (OMEGA-3 FISH OIL) 1200 MG CAPS Take 1 capsule by mouth daily.    [name] OVER THE COUNTER MEDICATION BEET ROOT 1 QD    . oxybutynin (DITROPAN-XL) 10 MG 24 hr tablet Take 10 mg by mouth daily.    . pantoprazole (PROTONIX) 20 MG tablet Take 1 tablet (20 mg total) by mouth daily. 30 tablet 0  . tamsulosin (FLOMAX) 0.4 MG CAPS capsule Take 0.4 mg daily by mouth.     . VENTOLIN HFA 108 (90 Base) MCG/ACT inhaler INHALE 2 PUFFS BY MOUTH INTO THE LUNGS  EVERY 6  HOURS AS NEEDED FOR WHEEZING AND FOR SHORTNESS OF BREATH 18 g 0  . vitamin k 100 MCG tablet Take 100 mcg by mouth daily.    . calcium carbonate (OSCAL) 1500 (600 Ca) MG TABS tablet Take 600 mg of elemental calcium by mouth daily with breakfast.    . Carbinoxamine Maleate (RYVENT) 6 MG TABS Take 6 mg by mouth 2 (two) times a day. 120 tablet 0  . Carbinoxamine Maleate (RYVENT) 6 MG TABS Take 2 tablets by mouth in the morning and at bedtime. 180 tablet 1  . Cholecalciferol (VITAMIN D) 2000 units CAPS Take 1 capsule by mouth daily.     No current facility-administered medications for this visit.     Known medication allergies: Allergies  Allergen Reactions  . Codeine Other (See Comments)    REACTION: skin irritation, pains, tenderness to touch  . Other Other (See Comments)    Narcotics-prefers not to take meds     Physical examination: Blood pressure 110/72, pulse 94, temperature 98 F (36.7 C), temperature source Temporal, resp. rate 16, height 5\' 9"  (1.753 m), weight 200 lb 3.2 oz (90.8 kg), SpO2 98 %.  General: Alert, interactive, in no acute distress. HEENT: PERRLA, TMs pearly gray, turbinates minimally edematous without discharge, post-pharynx non erythematous. Neck: Supple without lymphadenopathy. Lungs: Clear to auscultation without wheezing, rhonchi or rales. {no increased work of breathing. CV: Normal S1, S2 without murmurs. Abdomen: Nondistended, nontender. Skin: Warm and dry, without lesions or rashes. Extremities:  No clubbing, cyanosis or edema. Neuro:   Grossly intact.  Diagnositics/Labs:  Spirometry: FEV1: 3.5 L 90%, FVC: 4.47 L 90%, ratio consistent with Nonobstructive pattern  Assessment and plan:   Allergic rhinitis with conjunctivitis - Continue avoidance measures for tree pollens, weed pollens, mold, dust mites, cat, dog.   - Continue Singulair 10mg  daily - Use Ryvent 6mg  twice a day.  This is an prescription based antihistamine that replaces Zyrtec.  -  Continue nasal saline rinses (Neti Pot) prior to use of medicated nasal sprays to help flush/clean the nose and sinuses and then use your medicated nasal sprays. - Use Xhance nasal spray device 2 sprays each nostril 1-2 times a day for nasal congestion control.  - For runny nose/drainage use nasal Atrovent 2 sprays each nostril up to 3-4 times a day as needed - Use Pazeo 1 drop each eye as needed for itchy/watery/red eyes - consider allergen immunotherapy (allergy shots) if medication management is not effective  Moderate persistent asthma - try Spiriva 1.25mg  2 inhalations once a day.  Use for next week.  Let us know if  this is controlling symptoms by early next week; based on this will decide on next steps.  I did discuss today that medications like Spiriva are not typically used as monotherapy for asthma control however he would like to not be on any inhaled steroid if possible.  We discussed today that the standard therapy for maintenance usually involves an inhaled steroid plus minus additional therapies.  We did discuss the option of getting down to the lowest amount of inhaled steroid needed to maintain control.  As above for the next week we will just try Spiriva and see how that does however it is likely he will need an inhaled steroid of some type in addition but hopeful that we will be able to decrease the inhaled steroid dose. - hold Advair 250/47mcg  this week.  - Singulair as above - Have access to albuterol inhaler 2 puffs every 4-6 hours as needed for cough/wheeze/shortness of breath/chest tightness.  May use 15-20 minutes prior to activity.   Monitor frequency of use.   -He is a candidate for asthma biologic injectable medications if we need to step up your therapy.  Asthma control goals:   Full participation in all desired activities (may need albuterol before activity)  Albuterol use two time or less a week on average (not counting use with activity)  Cough interfering with sleep  two time or less a month  Oral steroids no more than once a year  No hospitalizations  If you can wear protective nose/mouth covering when dealing with dust/pollens/hay and other products that can be airborne.   Follow-up 4 months or sooner if needed  I appreciate the opportunity to take part in Cataldo's care. Please do not hesitate to contact me with questions.  Sincerely,   Prudy Feeler, MD Allergy/Immunology [facility]

## 2020-04-17 NOTE — Patient Instructions (Signed)
Allergic Rhinoconjunctivitis - Continue avoidance measures for tree pollens, weed pollens, mold, dust mites, cat, dog.   - Continue Singulair 10mg  daily - Use Ryvent 6mg  twice a day.  This is an prescription based antihistamine that replaces Zyrtec.  - Continue nasal saline rinses (Neti Pot) prior to use of medicated nasal sprays to help flush/clean the nose and sinuses and then use your medicated nasal sprays. - Use Xhance nasal spray device 2 sprays each nostril 1-2 times a day for nasal congestion control.  - For runny nose/drainage use nasal Atrovent 2 sprays each nostril up to 3-4 times a day as needed - Use Pazeo 1 drop each eye as needed for itchy/watery/red eyes - consider allergen immunotherapy (allergy shots) if medication management is not effective  Moderate persistent asthma - try Spiriva 1.25mg  2 inhalations once a day.  Use for next week.  Let us know if this is controlling symptoms by early next week; based on this will decide on next steps.  - hold Advair 250/65mcg  this week.  - Singulair as above - Have access to albuterol inhaler 2 puffs every 4-6 hours as needed for cough/wheeze/shortness of breath/chest tightness.  May use 15-20 minutes prior to activity.   Monitor frequency of use.   - You are a candidate for asthma biologic injectable medications if we need to step up your therapy.  Asthma control goals:   Full participation in all desired activities (may need albuterol before activity)  Albuterol use two time or less a week on average (not counting use with activity)  Cough interfering with sleep two time or less a month  Oral steroids no more than once a year  No hospitalizations  If you can wear protective nose/mouth covering when dealing with dust/pollens/hay and other products that can be airborne.   Follow-up 4 months or sooner if needed

## 2020-04-18 ENCOUNTER — Other Ambulatory Visit: Payer: Self-pay | Admitting: Allergy

## 2020-04-18 MED ORDER — CARBINOXAMINE MALEATE 6 MG PO TABS: 2.0000 | ORAL_TABLET | Freq: Two times a day (BID) | ORAL | 1 refills | Status: DC

## 2020-04-27 ENCOUNTER — Other Ambulatory Visit: Payer: Self-pay | Admitting: Family Medicine

## 2020-06-04 ENCOUNTER — Other Ambulatory Visit: Payer: Self-pay | Admitting: Family Medicine

## 2020-07-01 ENCOUNTER — Other Ambulatory Visit: Payer: Self-pay

## 2020-07-01 ENCOUNTER — Telehealth: Payer: Self-pay | Admitting: Family Medicine

## 2020-07-01 DIAGNOSIS — K130 Diseases of lips: Secondary | ICD-10-CM

## 2020-07-01 NOTE — Telephone Encounter (Signed)
Patient would like a referral to a dermatologist - he has spots on his lip that the dentist was concerned about.

## 2020-07-01 NOTE — Telephone Encounter (Signed)
[facility] for referral to derm- dx lip lesion

## 2020-07-01 NOTE — Telephone Encounter (Signed)
Referral has been placed for dermatology. Dx-lip lesion

## 2020-07-01 NOTE — Telephone Encounter (Signed)
Patient would like a referral to a dermatologist. He has spots on his lips that dentist was concerned about.

## 2020-07-10 ENCOUNTER — Other Ambulatory Visit: Payer: Self-pay | Admitting: Family Medicine

## 2020-08-14 ENCOUNTER — Other Ambulatory Visit: Payer: Self-pay | Admitting: Allergy

## 2020-08-15 ENCOUNTER — Other Ambulatory Visit: Payer: Self-pay | Admitting: Orthopedic Surgery

## 2020-08-15 DIAGNOSIS — T84031A Mechanical loosening of internal left hip prosthetic joint, initial encounter: Secondary | ICD-10-CM

## 2020-08-19 ENCOUNTER — Other Ambulatory Visit: Payer: Self-pay | Admitting: Family Medicine

## 2020-08-20 ENCOUNTER — Encounter (HOSPITAL_COMMUNITY)
Admission: RE | Admit: 2020-08-20 | Discharge: 2020-08-20 | Disposition: A | Discharge status: Home / Self Care | Payer: 59 | Source: Ambulatory Visit | Attending: Orthopedic Surgery | Admitting: Orthopedic Surgery

## 2020-08-20 ENCOUNTER — Other Ambulatory Visit: Payer: Self-pay

## 2020-08-20 ENCOUNTER — Encounter: Payer: Self-pay | Admitting: Emergency Medicine

## 2020-08-20 DIAGNOSIS — T84031A Mechanical loosening of internal left hip prosthetic joint, initial encounter: Secondary | ICD-10-CM | POA: Diagnosis not present

## 2020-08-20 MED ORDER — TECHNETIUM TC 99M MEDRONATE IV KIT
20.0000 | PACK | Freq: Once | INTRAVENOUS | Status: AC | PRN
  Administered 2020-08-20: 20 via INTRAVENOUS

## 2020-08-20 NOTE — Telephone Encounter (Signed)
My chart message sent to patient to schedule an appointment for follow up. This can be video.

## 2020-08-23 ENCOUNTER — Ambulatory Visit: Payer: 59 | Admitting: Allergy

## 2020-08-23 ENCOUNTER — Telehealth: Payer: Self-pay | Admitting: Family Medicine

## 2020-08-23 NOTE — Telephone Encounter (Signed)
..  Medication Refills  Medication:  Wellbutrin Pharmacy: [facility] [place]  ** Let patient know to contact pharmacy at the end of the day to make sure medication is ready.**  ** Please notify patient to allow 48-72 hours to process.**  ** Encourage patient to contact the pharmacy for refills or they can request refills through [facility]**  Clinical Fills out below:   Last refill:  QTY:  Refill Date:    Other Comments:   Okay for refill?  Please advise.

## 2020-08-23 NOTE — Telephone Encounter (Signed)
Rx sent to the preferred patient pharmacy  

## 2020-08-26 ENCOUNTER — Ambulatory Visit (INDEPENDENT_AMBULATORY_CARE_PROVIDER_SITE_OTHER): Payer: 59 | Admitting: Family Medicine

## 2020-08-26 ENCOUNTER — Encounter: Payer: Self-pay | Admitting: Family Medicine

## 2020-08-26 DIAGNOSIS — F411 Generalized anxiety disorder: Secondary | ICD-10-CM | POA: Diagnosis not present

## 2020-08-26 DIAGNOSIS — L568 Other specified acute skin changes due to ultraviolet radiation: Secondary | ICD-10-CM | POA: Insufficient documentation

## 2020-08-26 DIAGNOSIS — M25552 Pain in left hip: Secondary | ICD-10-CM

## 2020-08-26 NOTE — Progress Notes (Signed)
   Virtual Visit via Video   I connected with patient on 08/26/20 at  7:30 AM EST by a video enabled telemedicine application and verified that I am speaking with the correct person using two identifiers.  Location patient: Home Location provider: Acupuncturist, Office Persons participating in the virtual visit: Patient, Provider  I discussed the limitations of evaluation and management by telemedicine and the availability of in person appointments. The patient expressed understanding and agreed to proceed.  Subjective:   HPI:   Anxiety- ongoing issue for pt.  Currently on Wellbutrin XL 150mg  daily.  Feels medicine is 'right on point'.  Still having issues w/ sleep due to prostate sxs.  Will still have days that feel overwhelming but vast majority of days are good.  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  L hip pain- pt is now seeing Dr Percell Miller.  Had bone scan a week ago.  Has MRI today.  Has hx of hip resurfacing in [place].  Has hx of osteopenia- taking daily Ca and Vit D.  ROS:   See pertinent positives and negatives per HPI.  Patient Active Problem List   Diagnosis Date Noted  . Vitamin D deficiency 10/18/2019  . Obesity (BMI 30-39.9) 10/13/2018  . Physical exam 10/11/2017  . Allergic rhinoconjunctivitis 07/01/2016  . Allergy-induced asthma 06/02/2016  . Anxiety state 12/12/2015  . Memory loss 12/12/2015  . Dysphagia, pharyngoesophageal 07/18/2014  . Left hip pain 11/24/2013  . Fungal infection of foot 07/20/2011  . HTN (hypertension) 06/19/2011  . Urinary frequency 06/19/2011  . SHOULDER, PAIN 03/20/2009  . PARESTHESIA, HANDS 12/11/2008    Social History   Tobacco Use  . Smoking status: Never Smoker  . Smokeless tobacco: Never Used  Substance Use Topics  . Alcohol use: Yes    Alcohol/week: 0.0 standard drinks    Comment: occasional     Allergies  Allergen Reactions  . Codeine Other  (See Comments)    REACTION: skin irritation, pains, tenderness to touch  . Other Other (See Comments)    Narcotics-prefers not to take meds    Objective:   There were no vitals taken for this visit. AAOx3, NAD NCAT, EOMI No obvious CN deficits Coloring WNL Pt is able to speak clearly, coherently without shortness of breath or increased work of breathing.  Thought process is linear.  Mood is appropriate.   Assessment and Plan:    See problem based charting   Annye Asa, MD 08/26/2020

## 2020-08-26 NOTE — Assessment & Plan Note (Signed)
Chronic problem.  Well controlled on Wellbutrin XL 150mg  daily.  Feels most days are 'on point' and the few overwhelming days he is able to easily turn those around.  No med changes at this time.  Will follow.

## 2020-08-26 NOTE — Assessment & Plan Note (Signed)
Following w/ Dr Percell Miller.  Had bone scan last week and MRI later today to better assess.  Hx of hip resurfacing in [place].  Will follow along.

## 2020-08-26 NOTE — Assessment & Plan Note (Signed)
New.  Pt had lower lip frozen by derm after pre-cancerous changes noted.  Is now planning on SPF or physical protection w/ a gaiter.  Will follow along.

## 2020-08-27 ENCOUNTER — Telehealth: Payer: Self-pay | Admitting: Family Medicine

## 2020-08-27 ENCOUNTER — Ambulatory Visit: Payer: 59 | Admitting: Family Medicine

## 2020-08-27 NOTE — Telephone Encounter (Signed)
Pt received the J & J vaccine on 06/05/20. Pt is scheduled [employer] for   J & J booster.

## 2020-09-03 ENCOUNTER — Ambulatory Visit: Payer: 59 | Attending: Internal Medicine

## 2020-09-03 ENCOUNTER — Other Ambulatory Visit (HOSPITAL_BASED_OUTPATIENT_CLINIC_OR_DEPARTMENT_OTHER): Payer: Self-pay | Admitting: Internal Medicine

## 2020-09-03 DIAGNOSIS — Z23 Encounter for immunization: Secondary | ICD-10-CM

## 2020-09-03 MED FILL — JANSSEN COVID-19 VACCINE 0.: 0.5 | 1 days supply | Qty: 1 | Fill #0

## 2020-09-03 NOTE — Progress Notes (Signed)
   Covid-19 Vaccination Clinic  Name:  Alexander Hernandez    MRN: [id number] DOB: [birth year]  09/03/2020  [name] was observed post Covid-19 immunization for 15 minutes without incident. He was provided with Vaccine Information Sheet and instruction to access the V-Safe system.  Vaccinated by [facility]  [name] was instructed to call 911 with any severe reactions post vaccine: [name] Difficulty breathing  . Swelling of face and throat  . A fast heartbeat  . A bad rash all over body  . Dizziness and weakness   Immunizations Administered    Name Date Dose VIS Date Route   JANSSEN COVID-19 VACCINE 09/03/2020  2:53 PM 0.5 mL 05/29/2020 Intramuscular   Manufacturer: Alphonsa Overall   Lot: [id number]   [place]: 50277-412-87

## 2020-09-19 ENCOUNTER — Ambulatory Visit: Payer: 59 | Admitting: Allergy

## 2020-09-19 ENCOUNTER — Telehealth: Payer: Self-pay | Admitting: Allergy

## 2020-09-19 ENCOUNTER — Other Ambulatory Visit: Payer: Self-pay

## 2020-09-19 ENCOUNTER — Encounter: Payer: Self-pay | Admitting: Allergy

## 2020-09-19 VITALS — BP 144/86 | HR 91 | Temp 98.3°F | Resp 16

## 2020-09-19 DIAGNOSIS — H1013 Acute atopic conjunctivitis, bilateral: Secondary | ICD-10-CM | POA: Diagnosis not present

## 2020-09-19 DIAGNOSIS — J4540 Moderate persistent asthma, uncomplicated: Secondary | ICD-10-CM

## 2020-09-19 DIAGNOSIS — J3089 Other allergic rhinitis: Secondary | ICD-10-CM

## 2020-09-19 MED ORDER — ALBUTEROL SULFATE HFA 108 (90 BASE) MCG/ACT IN AERS: 2.0000 | INHALATION_SPRAY | Freq: Four times a day (QID) | RESPIRATORY_TRACT | 1 refills | Status: DC | PRN

## 2020-09-19 MED ORDER — RYVENT 6 MG PO TABS: 6.0000 mg | ORAL_TABLET | Freq: Two times a day (BID) | ORAL | 5 refills | Status: DC

## 2020-09-19 MED ORDER — FLUTICASONE-SALMETEROL 100-50 MCG/DOSE IN AEPB: 1.0000 | INHALATION_SPRAY | Freq: Two times a day (BID) | RESPIRATORY_TRACT | 5 refills | Status: DC

## 2020-09-19 NOTE — Progress Notes (Unsigned)
Follow-up Note  RE: Alexander Hernandez MRN: [id number] DOB: 10/25/1975 Date of Office Visit: 09/19/2020   History of present illness: Alexander Hernandez is a 45 y.o. male presenting today for follow-up of allergic rhinitis with conjunctivitis and asthma.  He was last seen in the office on 04/17/20 by myself.   He would still like to be on the least medications possible to control his asthma.  However he does note when he misses Advair dose then several days later he may need to use his albuterol due to symptoms.  He takes 1 puff twice a day of advair 285mcg diskus.  He also continues on daily singulair.  He has not required any UC/ED visits or systemic steroid needs however for flares.  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  He is taking zyrtec-D.  He did use Xhance and it was helpful with congestion symptoms however it was not covered by insurance and was too expensive out-of-pocket.  He states the ryvent was effective and better than plain zyrtec and he completed the samples.  He never was contacted by the specialty pharmacy regarding prescription thus did not have any to continue to take once the samples ran out.     Review of systems: Review of Systems  Constitutional: Negative.   HENT: Positive for congestion.   Eyes: Negative.   Respiratory: Positive for cough and shortness of breath.   Cardiovascular: Negative.   Gastrointestinal: Negative.   Musculoskeletal: Negative.   Skin: Negative.   Neurological: Negative.     All other systems negative unless noted above in HPI  Past medical/social/surgical/family history have been reviewed and are unchanged unless specifically indicated below.  No changes  Medication List: Current Outpatient Medications  Medication Sig Dispense Refill  . ADVAIR DISKUS 250-50 MCG/DOSE AEPB INHALE 1 DOSE BY MOUTH TWICE DAILY  60 each 1  . Ascorbic Acid (VITAMIN C) 1000 MG tablet Take 1,000 mg by mouth daily.    [name] azelastine (ASTELIN) 0.1 % nasal spray Place 2 sprays into both nostrils 2 (two) times daily. 30 mL 1  . buPROPion (WELLBUTRIN XL) 150 MG 24 hr tablet TAKE 1 TABLET BY MOUTH ONCE DAILY  30 tablet 0  . calcium carbonate (OSCAL) 1500 (600 Ca) MG TABS tablet Take 600 mg of elemental calcium by mouth daily with breakfast.    . cetirizine-pseudoephedrine (ZYRTEC-D ALLERGY & CONGESTION) 5-120 MG tablet Take 1 tablet 2 (two) times daily by mouth. 60 tablet 1  . Cholecalciferol (VITAMIN D) 2000 units CAPS Take 1 capsule by mouth daily.    . fluticasone (FLONASE) 50 MCG/ACT nasal spray Place 1 spray into both nostrils daily. 16 g 5  . Fluticasone-Salmeterol (ADVAIR DISKUS) 100-50 MCG/DOSE AEPB Inhale 1 puff into the lungs 2 (two) times daily. 60 each 5  . magnesium gluconate (MAGONATE) 500 MG tablet Take 500 mg by mouth 2 (two) times daily.    . montelukast (SINGULAIR) 10 MG tablet TAKE 1 TABLET BY MOUTH AT BEDTIME 90 tablet 0  . Omega-3 Fatty Acids (OMEGA-3 FISH OIL) 1200 MG CAPS Take 1 capsule by mouth daily.    [name] OVER THE COUNTER MEDICATION BEET ROOT 1 QD    . oxybutynin (DITROPAN-XL) 10 MG 24 hr tablet Take 10 mg by mouth daily.    . pantoprazole (PROTONIX) 20 MG tablet Take 1  tablet (20 mg total) by mouth daily. 30 tablet 0  . RYVENT 6 MG TABS Take 6 mg by mouth in the morning and at bedtime. 60 tablet 5  . solifenacin (VESICARE) 10 MG tablet Take 10 mg by mouth daily.    . tamsulosin (FLOMAX) 0.4 MG CAPS capsule Take 0.4 mg daily by mouth.     . VENTOLIN HFA 108 (90 Base) MCG/ACT inhaler INHALE 2 PUFFS BY MOUTH INTO THE LUNGS  EVERY 6 HOURS AS NEEDED FOR WHEEZING AND FOR SHORTNESS OF BREATH 18 g 0  . vitamin k 100 MCG tablet Take 100 mcg by mouth daily.    . VENTOLIN HFA 108 (90 Base) MCG/ACT inhaler INHALE 2 PUFFS BY MOUTH EVERY 6 HOURS AS NEEDED FOR WHEEZING FOR SHORTNESS OF BREATH 18 g 1   No current  facility-administered medications for this visit.     Known medication allergies: Allergies  Allergen Reactions  . Codeine Other (See Comments)    REACTION: skin irritation, pains, tenderness to touch  . Other Other (See Comments)    Narcotics-prefers not to take meds     Physical examination: Blood pressure (!) 144/86, pulse 91, temperature 98.3 F (36.8 C), temperature source Temporal, resp. rate 16, SpO2 95 %.  General: Alert, interactive, in no acute distress. HEENT: PERRLA, TMs pearly gray, turbinates minimally edematous without discharge, post-pharynx non erythematous. Neck: Supple without lymphadenopathy. Lungs: Clear to auscultation without wheezing, rhonchi or rales. {no increased work of breathing. CV: Normal S1, S2 without murmurs. Abdomen: Nondistended, nontender. Skin: Warm and dry, without lesions or rashes. Extremities:  No clubbing, cyanosis or edema. Neuro:   Grossly intact.  Diagnositics/Labs: None today  Assessment and plan:   Allergic rhinitis with conjunctivitis - Continue avoidance measures for tree pollens, weed pollens, mold, dust mites, cat, dog.   - Continue Singulair 10mg  daily - Use Ryvent 6mg  twice a day.  Will resend this prescription.  Let us know if you do not hear from the [facility] (out of [place]) - Continue nasal saline rinses (Neti Pot) prior to use of medicated nasal sprays to help flush/clean the nose and sinuses and then use your medicated nasal sprays. - Use Xhance nasal spray device 2 sprays each nostril 1-2 times a day for nasal congestion control.   - For runny nose/drainage use nasal Atrovent 2 sprays each nostril up to 3-4 times a day as needed - Use Pazeo 1 drop each eye as needed for itchy/watery/red eyes - consider allergen immunotherapy (allergy shots) if medication management is not effective  Moderate persistent asthma -step down to Advair 153mcg 1 puff twice a day.  Let us know if this is controll symptoms well.   If not and you are needing to use more albuterol then go to the your current 243mcg Advair inhaler - Singulair as above - Have access to albuterol inhaler 2 puffs every 4-6 hours as needed for cough/wheeze/shortness of breath/chest tightness.  May use 15-20 minutes prior to activity.   Monitor frequency of use.   - You are a candidate for asthma biologic injectable medications if we need to step up your therapy.  Asthma control goals:   Full participation in all desired activities (may need albuterol before activity)  Albuterol use two time or less a week on average (not counting use with activity)  Cough interfering with sleep two time or less a month  Oral steroids no more than once a year  No hospitalizations   Wear protective nose/mouth  covering when dealing with dust/pollens/hay and other products that can be airborne.   Follow-up 4-6 months or sooner if needed  I appreciate the opportunity to take part in Marquist's care. Please do not hesitate to contact me with questions.  Sincerely,   Prudy Feeler, MD Allergy/Immunology [facility]

## 2020-09-19 NOTE — Patient Instructions (Addendum)
Allergic Rhinoconjunctivitis - Continue avoidance measures for tree pollens, weed pollens, mold, dust mites, cat, dog.   - Continue Singulair 10mg  daily - Use Ryvent 6mg  twice a day.  Will resend this prescription.  Let us know if you do not hear from the [facility] (out of [place]) - Continue nasal saline rinses (Neti Pot) prior to use of medicated nasal sprays to help flush/clean the nose and sinuses and then use your medicated nasal sprays. - Use Xhance nasal spray device 2 sprays each nostril 1-2 times a day for nasal congestion control.   - For runny nose/drainage use nasal Atrovent 2 sprays each nostril up to 3-4 times a day as needed - Use Pazeo 1 drop each eye as needed for itchy/watery/red eyes - consider allergen immunotherapy (allergy shots) if medication management is not effective  Moderate persistent asthma -step down to Advair 12mcg 1 puff twice a day.  Let us know if this is controll symptoms well.  If not and you are needing to use more albuterol then go to the your current 210mcg Advair inhaler - Singulair as above - Have access to albuterol inhaler 2 puffs every 4-6 hours as needed for cough/wheeze/shortness of breath/chest tightness.  May use 15-20 minutes prior to activity.   Monitor frequency of use.   - You are a candidate for asthma biologic injectable medications if we need to step up your therapy.  Asthma control goals:   Full participation in all desired activities (may need albuterol before activity)  Albuterol use two time or less a week on average (not counting use with activity)  Cough interfering with sleep two time or less a month  Oral steroids no more than once a year  No hospitalizations   Wear protective nose/mouth covering when dealing with dust/pollens/hay and other products that can be airborne.   Follow-up 4-6 months or sooner if needed

## 2020-09-20 MED ORDER — ALBUTEROL SULFATE HFA 108 (90 BASE) MCG/ACT IN AERS: 2.0000 | INHALATION_SPRAY | Freq: Four times a day (QID) | RESPIRATORY_TRACT | 1 refills | Status: DC | PRN

## 2020-09-20 NOTE — Addendum Note (Signed)
Addended by: Lucrezia Starch I on: 09/20/2020 02:13 PM   Modules accepted: Orders

## 2020-09-20 NOTE — Telephone Encounter (Signed)
Prescription for generic albuterol has been sent in to the pharmacy.

## 2020-09-20 NOTE — Telephone Encounter (Signed)
Patient called returning a phone call. Informed patient that the generic Ventolin was called into the [place] so insurance would cover it. Patient verbalized understanding.

## 2020-09-20 NOTE — Telephone Encounter (Signed)
[place] called and states that [facility] will not cover name brand Ventolin.  Please advise.

## 2020-09-26 ENCOUNTER — Other Ambulatory Visit: Payer: Self-pay | Admitting: Family Medicine

## 2020-09-27 ENCOUNTER — Telehealth: Payer: Self-pay | Admitting: *Deleted

## 2020-09-27 NOTE — Telephone Encounter (Signed)
PA has been submitted through CoverMyMeds for Ryvent and is currently pending approval/denial.  ?

## 2020-09-27 NOTE — Telephone Encounter (Signed)
PA was denied for Ryvent stated that the medication is excluded. It did state that Carbinoxamine 4mg  is preferred. Please advise.

## 2020-09-27 NOTE — Telephone Encounter (Signed)
Carbinoxamine 4 mg is fine.  He can take 2 tablets twice a day as needed

## 2020-09-30 MED ORDER — CARBINOXAMINE MALEATE 4 MG PO TABS: 4.0000 mg | ORAL_TABLET | Freq: Two times a day (BID) | ORAL | 5 refills | Status: DC

## 2020-09-30 MED ORDER — CARBINOXAMINE MALEATE 4 MG PO TABS: 8.0000 mg | ORAL_TABLET | Freq: Two times a day (BID) | ORAL | 5 refills | Status: DC

## 2020-09-30 NOTE — Addendum Note (Signed)
Addended by: Felipa Emory on: 09/30/2020 11:49 AM   Modules accepted: Orders

## 2020-09-30 NOTE — Addendum Note (Signed)
Addended by: Isabel Caprice on: 09/30/2020 12:14 PM   Modules accepted: Orders

## 2020-09-30 NOTE — Telephone Encounter (Signed)
Sent in script for Carbinoxamine 4mg  twice a day.

## 2020-09-30 NOTE — Telephone Encounter (Signed)
Carbinoxamine 4mg  2 tablets twice daily as needed has been sent in

## 2020-10-21 ENCOUNTER — Encounter: Payer: 59 | Admitting: Family Medicine

## 2020-11-06 ENCOUNTER — Encounter: Payer: 59 | Admitting: Family Medicine

## 2020-11-12 ENCOUNTER — Other Ambulatory Visit: Payer: Self-pay | Admitting: Family Medicine

## 2020-11-13 ENCOUNTER — Ambulatory Visit (INDEPENDENT_AMBULATORY_CARE_PROVIDER_SITE_OTHER): Payer: 59 | Admitting: Family Medicine

## 2020-11-13 ENCOUNTER — Other Ambulatory Visit: Payer: Self-pay | Admitting: Family Medicine

## 2020-11-13 ENCOUNTER — Other Ambulatory Visit: Payer: Self-pay

## 2020-11-13 ENCOUNTER — Encounter: Payer: Self-pay | Admitting: Family Medicine

## 2020-11-13 VITALS — BP 124/70 | HR 76 | Temp 97.8°F | Resp 16 | Ht 69.0 in | Wt 198.4 lb

## 2020-11-13 DIAGNOSIS — Z1211 Encounter for screening for malignant neoplasm of colon: Secondary | ICD-10-CM

## 2020-11-13 DIAGNOSIS — Z125 Encounter for screening for malignant neoplasm of prostate: Secondary | ICD-10-CM | POA: Diagnosis not present

## 2020-11-13 DIAGNOSIS — E559 Vitamin D deficiency, unspecified: Secondary | ICD-10-CM | POA: Diagnosis not present

## 2020-11-13 DIAGNOSIS — Z0000 Encounter for general adult medical examination without abnormal findings: Secondary | ICD-10-CM

## 2020-11-13 DIAGNOSIS — E663 Overweight: Secondary | ICD-10-CM | POA: Diagnosis not present

## 2020-11-13 DIAGNOSIS — B009 Herpesviral infection, unspecified: Secondary | ICD-10-CM | POA: Insufficient documentation

## 2020-11-13 LAB — CBC WITH DIFFERENTIAL/PLATELET
Basophils Absolute: 0.1 10*3/uL (ref 0.0–0.1)
Basophils Relative: 1 % (ref 0.0–3.0)
Eosinophils Absolute: 0.7 10*3/uL (ref 0.0–0.7)
Eosinophils Relative: 10.5 % — ABNORMAL HIGH (ref 0.0–5.0)
HCT: 44.6 % (ref 39.0–52.0)
Hemoglobin: 15.2 g/dL (ref 13.0–17.0)
Lymphocytes Relative: 20.3 % (ref 12.0–46.0)
Lymphs Abs: 1.3 10*3/uL (ref 0.7–4.0)
MCHC: 34.1 g/dL (ref 30.0–36.0)
MCV: 86.5 fl (ref 78.0–100.0)
Monocytes Absolute: 0.5 10*3/uL (ref 0.1–1.0)
Monocytes Relative: 8.8 % (ref 3.0–12.0)
Neutro Abs: 3.7 10*3/uL (ref 1.4–7.7)
Neutrophils Relative %: 59.4 % (ref 43.0–77.0)
Platelets: 221 10*3/uL (ref 150.0–400.0)
RBC: 5.16 Mil/uL (ref 4.22–5.81)
RDW: 12.8 % (ref 11.5–15.5)
WBC: 6.2 10*3/uL (ref 4.0–10.5)

## 2020-11-13 LAB — HEPATIC FUNCTION PANEL
ALT: 28 U/L (ref 0–53)
AST: 20 U/L (ref 0–37)
Albumin: 4.3 g/dL (ref 3.5–5.2)
Alkaline Phosphatase: 59 U/L (ref 39–117)
Bilirubin, Direct: 0.1 mg/dL (ref 0.0–0.3)
Total Bilirubin: 0.6 mg/dL (ref 0.2–1.2)
Total Protein: 6.7 g/dL (ref 6.0–8.3)

## 2020-11-13 LAB — LIPID PANEL
Cholesterol: 159 mg/dL (ref 0–200)
HDL: 42.4 mg/dL (ref >39.00)
LDL Cholesterol: 99 mg/dL (ref 0–99)
NonHDL: 116.99
Total CHOL/HDL Ratio: 4
Triglycerides: 89 mg/dL (ref 0.0–149.0)
VLDL: 17.8 mg/dL (ref 0.0–40.0)

## 2020-11-13 LAB — BASIC METABOLIC PANEL
BUN: 13 mg/dL (ref 6–23)
CO2: 28 mEq/L (ref 19–32)
Calcium: 9.2 mg/dL (ref 8.4–10.5)
Chloride: 106 mEq/L (ref 96–112)
Creatinine, Ser: 1.09 mg/dL (ref 0.40–1.50)
GFR: 81.1 mL/min (ref >60.00)
Glucose, Bld: 92 mg/dL (ref 70–99)
Potassium: 4 mEq/L (ref 3.5–5.1)
Sodium: 140 mEq/L (ref 135–145)

## 2020-11-13 LAB — TSH: TSH: 1.42 u[IU]/mL (ref 0.35–4.50)

## 2020-11-13 LAB — PSA: PSA: 0.43 ng/mL (ref 0.10–4.00)

## 2020-11-13 LAB — VITAMIN D 25 HYDROXY (VIT D DEFICIENCY, FRACTURES): VITD: 45.47 ng/mL (ref 30.00–100.00)

## 2020-11-13 MED ORDER — BUPROPION HCL ER (XL) 150 MG PO TB24
150.0000 mg | ORAL_TABLET | Freq: Every day | ORAL | 1 refills | Status: DC
Start: 2020-11-13 — End: 2021-06-30

## 2020-11-13 MED ORDER — VALACYCLOVIR HCL 1 G PO TABS: 1000.0000 mg | ORAL_TABLET | Freq: Every day | ORAL | 3 refills | Status: DC

## 2020-11-13 NOTE — Assessment & Plan Note (Signed)
Pt's BMI is 29.3  He is trying to eat better despite having to eat on the run due to work.  He also plans to resume working out once the weather is nice.  Check labs to risk stratify.

## 2020-11-13 NOTE — Progress Notes (Signed)
Prescription refilled.

## 2020-11-13 NOTE — Progress Notes (Signed)
   Subjective:    Patient ID: Alexander Hernandez, male    DOB: [birth year], 45 y.o.   MRN: [id number]  HPI CPE- UTD on COVID vaccine, Tdap.  Due to start colon cancer screening  Reviewed past medical, surgical, family and social histories.  Health Maintenance  Topic Date Due  . Hepatitis C Screening  Never done  . HIV Screening  Never done  . COLONOSCOPY (Pts 45-31yrs Insurance coverage will need to be confirmed)  Never done  . COVID-19 Vaccine (2 - Booster for [employer] series) 10/29/2020  . INFLUENZA VACCINE  03/10/2021  . TETANUS/TDAP  01/28/2024  . HPV VACCINES  Aged Out      Review of Systems Patient reports no vision/hearing changes, anorexia, fever ,adenopathy, persistant/recurrent hoarseness, swallowing issues, chest pain, palpitations, edema, persistant/recurrent cough, hemoptysis, dyspnea (rest,exertional, paroxysmal nocturnal), gastrointestinal  bleeding (melena, rectal bleeding), abdominal pain, excessive heart burn, GU symptoms (dysuria, hematuria, voiding/incontinence issues) syncope, focal weakness, memory loss, numbness & tingling, skin/hair/nail changes, depression, anxiety, abnormal bruising/bleeding, musculoskeletal symptoms/signs.   This visit occurred during the SARS-CoV-2 public health emergency.  Safety protocols were in place, including screening questions prior to the visit, additional usage of staff PPE, and extensive cleaning of exam room while observing appropriate contact time as indicated for disinfecting solutions.       Objective:   Physical Exam General Appearance:    Alert, cooperative, no distress, appears stated age  Head:    Normocephalic, without obvious abnormality, atraumatic  Eyes:    PERRL, conjunctiva/corneas clear, EOM's intact, fundi    benign, both eyes       Ears:    Normal TM's and external ear canals, both ears  Nose:   Nares normal, septum midline, mucosa normal, no drainage   or sinus tenderness  Throat:   Lips, mucosa, and tongue normal;  teeth and gums normal  Neck:   Supple, symmetrical, trachea midline, no adenopathy;       thyroid:  No enlargement/tenderness/nodules  Back:     Symmetric, no curvature, ROM normal, no CVA tenderness  Lungs:     Clear to auscultation bilaterally, respirations unlabored  Chest wall:    No tenderness or deformity  Heart:    Regular rate and rhythm, S1 and S2 normal, no murmur, rub   or gallop  Abdomen:     Soft, non-tender, bowel sounds active all four quadrants,    no masses, no organomegaly  Genitalia:    deferred  Rectal:    Extremities:   Extremities normal, atraumatic, no cyanosis or edema  Pulses:   2+ and symmetric all extremities  Skin:   Skin color, texture, turgor normal, no rashes or lesions  Lymph nodes:   Cervical, supraclavicular, and axillary nodes normal  Neurologic:   CNII-XII intact. Normal strength, sensation and reflexes      throughout          Assessment & Plan:

## 2020-11-13 NOTE — Assessment & Plan Note (Signed)
Check labs and replete prn. 

## 2020-11-13 NOTE — Assessment & Plan Note (Signed)
Pt reports outbreaks every few months.  Will start suppressive Valtrex tx.

## 2020-11-13 NOTE — Assessment & Plan Note (Signed)
Pt's PE WNL.  UTD on Tdap, COVID.  Referral placed for colon cancer screening.  Check labs.  Anticipatory guidance provided.

## 2020-11-13 NOTE — Patient Instructions (Signed)
Follow up in 1 year or as needed We'll notify you of your lab results and make any changes if needed Continue to work on healthy diet and regular exercise- you can do it!! We'll call you with your GI appt to discuss colonoscopy Call with any questions or concerns [place]!!!

## 2021-02-05 ENCOUNTER — Encounter: Payer: Self-pay | Admitting: *Deleted

## 2021-02-19 ENCOUNTER — Ambulatory Visit: Payer: 59 | Admitting: Allergy

## 2021-02-20 ENCOUNTER — Ambulatory Visit: Payer: 59 | Admitting: Family

## 2021-02-20 DIAGNOSIS — J309 Allergic rhinitis, unspecified: Secondary | ICD-10-CM

## 2021-03-18 ENCOUNTER — Telehealth: Payer: Self-pay | Admitting: Allergy

## 2021-03-18 NOTE — Telephone Encounter (Signed)
Pa has been completed waiting on results key [id number]

## 2021-03-18 NOTE — Telephone Encounter (Signed)
[facility] called and states the prior authorization key was sent via fax. She states someone needs to login into covermymeds and sumbit the key, because patient is in need of his medications.  Please advise.

## 2021-04-24 ENCOUNTER — Encounter: Payer: Self-pay | Admitting: Allergy

## 2021-04-24 ENCOUNTER — Other Ambulatory Visit: Payer: Self-pay

## 2021-04-24 ENCOUNTER — Ambulatory Visit: Payer: 59 | Admitting: Allergy

## 2021-04-24 VITALS — BP 122/88 | HR 66 | Temp 98.4°F | Resp 18 | Ht 69.0 in | Wt 204.8 lb

## 2021-04-24 DIAGNOSIS — J309 Allergic rhinitis, unspecified: Secondary | ICD-10-CM

## 2021-04-24 DIAGNOSIS — H1013 Acute atopic conjunctivitis, bilateral: Secondary | ICD-10-CM

## 2021-04-24 DIAGNOSIS — H1010 Acute atopic conjunctivitis, unspecified eye: Secondary | ICD-10-CM

## 2021-04-24 DIAGNOSIS — J4540 Moderate persistent asthma, uncomplicated: Secondary | ICD-10-CM | POA: Diagnosis not present

## 2021-04-24 MED ORDER — KARBINAL ER 4 MG/5ML PO SUER: 8.0000 mg | Freq: Two times a day (BID) | ORAL | 3 refills | Status: DC

## 2021-04-24 MED ORDER — IPRATROPIUM BROMIDE 0.06 % NA SOLN: 2.0000 | Freq: Two times a day (BID) | NASAL | 5 refills | Status: DC

## 2021-04-24 MED ORDER — PSEUDOEPHEDRINE HCL ER 120 MG PO TB12: 120.0000 mg | ORAL_TABLET | Freq: Two times a day (BID) | ORAL | 0 refills | Status: DC | PRN

## 2021-04-24 MED ORDER — MONTELUKAST SODIUM 10 MG PO TABS: 10.0000 mg | ORAL_TABLET | Freq: Every day | ORAL | 1 refills | Status: DC

## 2021-04-24 MED ORDER — ALBUTEROL SULFATE HFA 108 (90 BASE) MCG/ACT IN AERS: 2.0000 | INHALATION_SPRAY | Freq: Four times a day (QID) | RESPIRATORY_TRACT | 1 refills | Status: DC | PRN

## 2021-04-24 NOTE — Patient Instructions (Addendum)
Allergic Rhinoconjunctivitis - Continue avoidance measures for tree pollens, weed pollens, mold, dust mites, cat, dog.   - Continue Singulair '10mg'$  daily - resume use of Carbinaxomine (Ryvent or Cristino Martes are brands).  If Ryvent is not covered then take Carbinaxomine Cristino Martes) '8mg'$  twice a day or 61m twice a day  - resume nasal saline rinses (Neti Pot) prior to use of medicated nasal sprays to help flush/clean the nose and sinuses and then use your medicated nasal sprays.  - For congestion/drainage use nasal Atrovent 2 sprays each nostril up to 3-4 times a day as needed - Use Pazeo 1 drop each eye as needed for itchy/watery/red eyes - for severe nasal congestion or sinus pressure can take pseudofed twice a day as needed.  Try to use sparingly - consider allergen immunotherapy (allergy shots) if medication management is not effective  Moderate persistent asthma -continue Advair 1034m 1 puff 1-2 a day.  Discussed today during weed season to take at least once a day - Singulair as above - Have access to albuterol inhaler 2 puffs every 4-6 hours as needed for cough/wheeze/shortness of breath/chest tightness.  May use 15-20 minutes prior to activity.   Monitor frequency of use.   - You are a candidate for asthma biologic injectable medications if we need to step up your therapy.  Asthma control goals:  Full participation in all desired activities (may need albuterol before activity) Albuterol use two time or less a week on average (not counting use with activity) Cough interfering with sleep two time or less a month Oral steroids no more than once a year No hospitalizations   Follow-up 4-6 months or sooner if needed

## 2021-04-24 NOTE — Progress Notes (Signed)
Follow-up Note  RE: Alexander Hernandez MRN: [id number] DOB: Jan 13, 1976 Date of Office Visit: 04/24/2021   History of present illness: Alexander Hernandez is a 45 y.o. male presenting today sick visit for allergy flare.  He has history of asthma and allergic rhinitis.  He was last seen in the office on 09/19/20 by myself.   He has been having increased allergy symptoms now that it is fall weed season.  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   At night he has states he has to mouth breath because his nose is congested.  He is doing a lot of sneezing as well as throat clearing.  He also states his eyes have been itchy and watery.  He has been taking Zyrtec but does not but it has been helping.  The RyVent did help when he had access to it but states his insurance did not cover it to continue on.  He also is not using any nasal sprays at this time.  He has performed nasal saline rinses but not recently and states he knows he needs to get back in doing this. He does not wear any face mask or mouth covering as he states he has to wear goggles and doing his job and the face coverings a difficult to wear with the goggles. He was able to decrease down to Advair 100 mcg inhaler.  He states he uses it twice a day for couple of days and then take some days where he does not use it and he does state by the time he has been 2 to 3 days off of it he does note some more shortness of breath.  At that time he will use it again for couple days and then come off of it again.  He also has access to an albuterol inhaler.  Review of systems: Review of Systems  Constitutional: Negative.   HENT:         See HPI  Eyes: Negative.   Respiratory: Negative.    Cardiovascular: Negative.   Gastrointestinal: Negative.   Musculoskeletal: Negative.   Skin: Negative.   Neurological: Negative.    All other systems negative unless noted above in HPI  Past  medical/social/surgical/family history have been reviewed and are unchanged unless specifically indicated below.  No changes  Medication List: Current Outpatient Medications  Medication Sig Dispense Refill   ADVAIR DISKUS 250-50 MCG/DOSE AEPB INHALE 1 DOSE BY MOUTH TWICE DAILY 60 each 1   Ascorbic Acid (VITAMIN C) 1000 MG tablet Take 1,000 mg by mouth daily.     buPROPion (WELLBUTRIN XL) 150 MG 24 hr tablet Take 1 tablet (150 mg total) by mouth daily. 90 tablet 1   calcium carbonate (OSCAL) 1500 (600 Ca) MG TABS tablet Take 600 mg of elemental calcium by mouth daily with breakfast.     Carbinoxamine Maleate ER [facility] ER) 4 MG/5ML SUER Take 8 mg by mouth 2 (two) times daily. 280 mL 3   Cholecalciferol (VITAMIN D) 2000 units CAPS Take 1 capsule by mouth daily.     fluticasone (FLONASE) 50 MCG/ACT nasal spray Place 1 spray into both nostrils daily. 16 g 5   Fluticasone-Salmeterol (ADVAIR DISKUS) 100-50 MCG/DOSE AEPB Inhale 1 puff into the lungs 2 (two) times daily. 60 each 5   ipratropium (ATROVENT) 0.06 % nasal spray Place 2 sprays into both nostrils in the morning and at bedtime. 15 mL  5   magnesium gluconate (MAGONATE) 500 MG tablet Take 500 mg by mouth 2 (two) times daily.     Omega-3 Fatty Acids (OMEGA-3 FISH OIL) 1200 MG CAPS Take 1 capsule by mouth daily.     OVER THE COUNTER MEDICATION BEET ROOT 1 QD     oxybutynin (DITROPAN-XL) 10 MG 24 hr tablet Take 10 mg by mouth daily.     pantoprazole (PROTONIX) 20 MG tablet Take 1 tablet (20 mg total) by mouth daily. 30 tablet 0   pseudoephedrine (SUDAFED 12 HOUR) 120 MG 12 hr tablet Take 1 tablet (120 mg total) by mouth 2 (two) times daily as needed (severe congestion/sinus pressure). 28 tablet 0   solifenacin (VESICARE) 10 MG tablet Take 10 mg by mouth daily.     tamsulosin (FLOMAX) 0.4 MG CAPS capsule Take 0.4 mg daily by mouth.      valACYclovir (VALTREX) 1000 MG tablet Take 1 tablet (1,000 mg total) by mouth daily. 90 tablet 3   vitamin k  100 MCG tablet Take 100 mcg by mouth daily.     albuterol (VENTOLIN HFA) 108 (90 Base) MCG/ACT inhaler Inhale 2 puffs into the lungs every 6 (six) hours as needed for wheezing or shortness of breath. 18 g 1   montelukast (SINGULAIR) 10 MG tablet Take 1 tablet (10 mg total) by mouth at bedtime. 90 tablet 1   No current facility-administered medications for this visit.     Known medication allergies: Allergies  Allergen Reactions   Codeine Other (See Comments)    REACTION: skin irritation, pains, tenderness to touch   Other Other (See Comments)    Narcotics-prefers not to take meds     Physical examination: Blood pressure 122/88, pulse 66, temperature 98.4 F (36.9 C), resp. rate 18, height '5\' 9"'$  (1.753 m), weight 204 lb 12.8 oz (92.9 kg), SpO2 98 %.  General: Alert, interactive, in no acute distress. HEENT: PERRLA, TMs pearly gray, turbinates moderately edematous with clear discharge, post-pharynx non erythematous. Neck: Supple without lymphadenopathy. Lungs: Clear to auscultation without wheezing, rhonchi or rales. {no increased work of breathing. CV: Normal S1, S2 without murmurs. Abdomen: Nondistended, nontender. Skin: Warm and dry, without lesions or rashes. Extremities:  No clubbing, cyanosis or edema. Neuro:   Grossly intact.  Diagnositics/Labs:  Spirometry: FEV1: 3.18L 79%, FVC: 4.05L 79%, ratio consistent with nonobstructive pattern  Assessment and plan:   Allergic Rhinoconjunctivitis - Continue avoidance measures for tree pollens, weed pollens, mold, dust mites, cat, dog.   - Continue Singulair '10mg'$  daily - resume use of Carbinaxomine (Ryvent or Cristino Martes are brands).  If Ryvent is not covered then take Carbinaxomine Cristino Martes) '8mg'$  twice a day or 59m twice a day  - resume nasal saline rinses (Neti Pot) prior to use of medicated nasal sprays to help flush/clean the nose and sinuses and then use your medicated nasal sprays.  - For congestion/drainage use nasal Atrovent  2 sprays each nostril up to 3-4 times a day as needed - Use Pazeo 1 drop each eye as needed for itchy/watery/red eyes - for severe nasal congestion or sinus pressure can take pseudofed twice a day as needed.  Try to use sparingly - consider allergen immunotherapy (allergy shots) if medication management is not effective  Moderate persistent asthma -continue Advair 1016m 1 puff 1-2 a day.  Discussed today during weed season to take at least once a day - Singulair as above - Have access to albuterol inhaler 2 puffs every 4-6 hours as needed for cough/wheeze/shortness  of breath/chest tightness.  May use 15-20 minutes prior to activity.   Monitor frequency of use.   - You are a candidate for asthma biologic injectable medications if we need to step up your therapy.  Asthma control goals:  Full participation in all desired activities (may need albuterol before activity) Albuterol use two time or less a week on average (not counting use with activity) Cough interfering with sleep two time or less a month Oral steroids no more than once a year No hospitalizations   Follow-up 4-6 months or sooner if needed  I appreciate the opportunity to take part in Alexander Hernandez's care. Please do not hesitate to contact me with questions.  Sincerely,   Prudy Feeler, MD Allergy/Immunology [facility]

## 2021-06-24 ENCOUNTER — Telehealth: Payer: Self-pay | Admitting: *Deleted

## 2021-06-24 NOTE — Telephone Encounter (Signed)
Received a fax from patient that patient is requesting more refills of Sudafed. Is this appropriate to refill for the patient?

## 2021-06-26 ENCOUNTER — Other Ambulatory Visit: Payer: Self-pay | Admitting: *Deleted

## 2021-06-26 MED ORDER — PSEUDOEPHEDRINE HCL ER 120 MG PO TB12: 120.0000 mg | ORAL_TABLET | Freq: Two times a day (BID) | ORAL | 2 refills | Status: AC | PRN

## 2021-06-26 NOTE — Telephone Encounter (Signed)
Refill has been sent in to pharmacy.

## 2021-06-27 ENCOUNTER — Telehealth: Payer: Self-pay

## 2021-06-27 NOTE — Telephone Encounter (Signed)
Called patient he plans to pick up his printed prescription for sudafed that Dr. Nelva Bush signed from the [facility] office Monday. Patient will call to let us know when he is on the way.

## 2021-06-30 ENCOUNTER — Other Ambulatory Visit: Payer: Self-pay | Admitting: Family Medicine

## 2021-08-23 ENCOUNTER — Other Ambulatory Visit: Payer: Self-pay | Admitting: Allergy

## 2021-08-28 ENCOUNTER — Ambulatory Visit: Payer: 59 | Admitting: Allergy

## 2021-08-28 DIAGNOSIS — J309 Allergic rhinitis, unspecified: Secondary | ICD-10-CM

## 2021-09-29 ENCOUNTER — Other Ambulatory Visit: Payer: Self-pay | Admitting: Family Medicine

## 2021-09-30 NOTE — Telephone Encounter (Signed)
Patient is requesting a refill of the following medications: Requested Prescriptions   Pending Prescriptions Disp Refills   buPROPion (WELLBUTRIN XL) 150 MG 24 hr tablet [Pharmacy Med Name: buPROPion HCl ER (XL) 150 MG Oral Tablet Extended Release 24 Hour] 90 tablet 0    Sig: Take 1 tablet by mouth once daily    Date of patient request: 09/30/21 Last office visit: 11/13/20 Date of last refill: 06/30/21 Last refill amount: 90

## 2021-11-14 ENCOUNTER — Encounter: Payer: 59 | Admitting: Family Medicine

## 2021-11-25 ENCOUNTER — Ambulatory Visit (INDEPENDENT_AMBULATORY_CARE_PROVIDER_SITE_OTHER): Payer: 59 | Admitting: Family Medicine

## 2021-11-25 ENCOUNTER — Encounter: Payer: Self-pay | Admitting: Family Medicine

## 2021-11-25 VITALS — BP 118/80 | HR 81 | Temp 98.3°F | Resp 16 | Ht 69.0 in | Wt 204.4 lb

## 2021-11-25 DIAGNOSIS — Z1159 Encounter for screening for other viral diseases: Secondary | ICD-10-CM

## 2021-11-25 DIAGNOSIS — E669 Obesity, unspecified: Secondary | ICD-10-CM | POA: Diagnosis not present

## 2021-11-25 DIAGNOSIS — E559 Vitamin D deficiency, unspecified: Secondary | ICD-10-CM

## 2021-11-25 DIAGNOSIS — Z125 Encounter for screening for malignant neoplasm of prostate: Secondary | ICD-10-CM

## 2021-11-25 DIAGNOSIS — Z1211 Encounter for screening for malignant neoplasm of colon: Secondary | ICD-10-CM

## 2021-11-25 DIAGNOSIS — Z0000 Encounter for general adult medical examination without abnormal findings: Secondary | ICD-10-CM | POA: Diagnosis not present

## 2021-11-25 DIAGNOSIS — Z114 Encounter for screening for human immunodeficiency virus [HIV]: Secondary | ICD-10-CM

## 2021-11-25 LAB — LIPID PANEL
Cholesterol: 167 mg/dL (ref 0–200)
HDL: 42.3 mg/dL (ref >39.00)
LDL Cholesterol: 105 mg/dL — ABNORMAL HIGH (ref 0–99)
NonHDL: 124.48
Total CHOL/HDL Ratio: 4
Triglycerides: 96 mg/dL (ref 0.0–149.0)
VLDL: 19.2 mg/dL (ref 0.0–40.0)

## 2021-11-25 LAB — HEPATIC FUNCTION PANEL
ALT: 29 U/L (ref 0–53)
AST: 20 U/L (ref 0–37)
Albumin: 4.5 g/dL (ref 3.5–5.2)
Alkaline Phosphatase: 56 U/L (ref 39–117)
Bilirubin, Direct: 0.1 mg/dL (ref 0.0–0.3)
Total Bilirubin: 0.6 mg/dL (ref 0.2–1.2)
Total Protein: 6.9 g/dL (ref 6.0–8.3)

## 2021-11-25 LAB — CBC WITH DIFFERENTIAL/PLATELET
Basophils Absolute: 0 10*3/uL (ref 0.0–0.1)
Basophils Relative: 0.6 % (ref 0.0–3.0)
Eosinophils Absolute: 0.6 10*3/uL (ref 0.0–0.7)
Eosinophils Relative: 10 % — ABNORMAL HIGH (ref 0.0–5.0)
HCT: 43.7 % (ref 39.0–52.0)
Hemoglobin: 15.1 g/dL (ref 13.0–17.0)
Lymphocytes Relative: 20.3 % (ref 12.0–46.0)
Lymphs Abs: 1.2 10*3/uL (ref 0.7–4.0)
MCHC: 34.5 g/dL (ref 30.0–36.0)
MCV: 85.6 fl (ref 78.0–100.0)
Monocytes Absolute: 0.5 10*3/uL (ref 0.1–1.0)
Monocytes Relative: 8 % (ref 3.0–12.0)
Neutro Abs: 3.5 10*3/uL (ref 1.4–7.7)
Neutrophils Relative %: 61.1 % (ref 43.0–77.0)
Platelets: 234 10*3/uL (ref 150.0–400.0)
RBC: 5.1 Mil/uL (ref 4.22–5.81)
RDW: 13.2 % (ref 11.5–15.5)
WBC: 5.8 10*3/uL (ref 4.0–10.5)

## 2021-11-25 LAB — BASIC METABOLIC PANEL
BUN: 12 mg/dL (ref 6–23)
CO2: 30 mEq/L (ref 19–32)
Calcium: 9.4 mg/dL (ref 8.4–10.5)
Chloride: 104 mEq/L (ref 96–112)
Creatinine, Ser: 1.18 mg/dL (ref 0.40–1.50)
GFR: 73.2 mL/min (ref >60.00)
Glucose, Bld: 107 mg/dL — ABNORMAL HIGH (ref 70–99)
Potassium: 4.2 mEq/L (ref 3.5–5.1)
Sodium: 140 mEq/L (ref 135–145)

## 2021-11-25 LAB — PSA: PSA: 0.43 ng/mL (ref 0.10–4.00)

## 2021-11-25 LAB — TSH: TSH: 1.41 u[IU]/mL (ref 0.35–5.50)

## 2021-11-25 LAB — VITAMIN D 25 HYDROXY (VIT D DEFICIENCY, FRACTURES): VITD: 48.03 ng/mL (ref 30.00–100.00)

## 2021-11-25 MED ORDER — VALACYCLOVIR HCL 1 G PO TABS: 1000.0000 mg | ORAL_TABLET | Freq: Every day | ORAL | 3 refills | Status: DC

## 2021-11-25 NOTE — Assessment & Plan Note (Signed)
Pt's PE WNL.  UTD on Tdap.  Due for colonoscopy- referral placed.  Check labs.  Anticipatory guidance provided.  ?

## 2021-11-25 NOTE — Patient Instructions (Addendum)
Follow up in 1 year or as needed ?We'll notify you of your lab results and make any changes if needed ?We'll call you with your GI appt for colonoscopy consultation ?Continue to work on healthy diet and regular exercise- you can do it! ?Call with any questions or concerns ?Stay Safe!  Stay Healthy! ?Happy Spring!!! ?

## 2021-11-25 NOTE — Progress Notes (Signed)
? ?  Subjective:  ? ? Patient ID: Alexander Hernandez, male    DOB: June 02, 1976, 46 y.o.   MRN: [id number] ? ?HPI ?CPE- due for colonoscopy.  UTD on Tdap ? ?Health Maintenance  ?Topic Date Due  ? HIV Screening  Never done  ? Hepatitis C Screening  Never done  ? COLONOSCOPY (Pts 45-58yr Insurance coverage will need to be confirmed)  Never done  ? COVID-19 Vaccine (2 - Booster for Janssen series) 10/29/2020  ? INFLUENZA VACCINE  03/10/2022  ? TETANUS/TDAP  01/28/2024  ? HPV VACCINES  Aged Out  ?  ? ? ?Review of Systems ?Patient reports no vision/hearing changes, anorexia, fever ,adenopathy, persistant/recurrent hoarseness, swallowing issues, chest pain, palpitations, edema, persistant/recurrent cough, hemoptysis, dyspnea (rest,exertional, paroxysmal nocturnal), gastrointestinal  bleeding (melena, rectal bleeding), abdominal pain, excessive heart burn, GU symptoms (dysuria, hematuria, voiding/incontinence issues) syncope, focal weakness, memory loss, numbness & tingling, skin/hair/nail changes, depression, anxiety, abnormal bruising/bleeding, musculoskeletal symptoms/signs.  ?   ?Objective:  ? Physical Exam ?General Appearance:    Alert, cooperative, no distress, appears stated age  ?Head:    Normocephalic, without obvious abnormality, atraumatic  ?Eyes:    PERRL, conjunctiva/corneas clear, EOM's intact, fundi  ?  benign, both eyes       ?Ears:    Normal TM's and external ear canals, both ears  ?Nose:   Nares normal, septum midline, mucosa normal, no drainage ?  or sinus tenderness  ?Throat:   Lips, mucosa, and tongue normal; teeth and gums normal  ?Neck:   Supple, symmetrical, trachea midline, no adenopathy;     ?  thyroid:  No enlargement/tenderness/nodules  ?Back:     Symmetric, no curvature, ROM normal, no CVA tenderness  ?Lungs:     Clear to auscultation bilaterally, respirations unlabored  ?Chest wall:    No tenderness or deformity  ?Heart:    Regular rate and rhythm, S1 and S2 normal, no murmur, rub ?  or gallop   ?Abdomen:     Soft, non-tender, bowel sounds active all four quadrants,  ?  no masses, no organomegaly  ?Genitalia:    deferred  ?Rectal:    ?Extremities:   Extremities normal, atraumatic, no cyanosis or edema  ?Pulses:   2+ and symmetric all extremities  ?Skin:   Skin color, texture, turgor normal, no rashes or lesions  ?Lymph nodes:   Cervical, supraclavicular, and axillary nodes normal  ?Neurologic:   CNII-XII intact. Normal strength, sensation and reflexes    ?  throughout  ?  ? ? ? ?   ?Assessment & Plan:  ? ? ?

## 2021-11-25 NOTE — Assessment & Plan Note (Signed)
Check labs and replete prn. 

## 2021-11-26 LAB — HEPATITIS C ANTIBODY
Hepatitis C Ab: NONREACTIVE
SIGNAL TO CUT-OFF: 0.09 (ref <1.00)

## 2021-11-26 LAB — HIV ANTIBODY (ROUTINE TESTING W REFLEX): HIV 1&2 Ab, 4th Generation: NONREACTIVE

## 2021-12-02 ENCOUNTER — Other Ambulatory Visit: Payer: Self-pay | Admitting: Allergy

## 2022-01-23 ENCOUNTER — Other Ambulatory Visit: Payer: Self-pay | Admitting: Allergy

## 2022-01-27 ENCOUNTER — Other Ambulatory Visit: Payer: Self-pay | Admitting: Allergy

## 2022-02-03 ENCOUNTER — Emergency Department (HOSPITAL_BASED_OUTPATIENT_CLINIC_OR_DEPARTMENT_OTHER)
Admission: EM | Admit: 2022-02-03 | Discharge: 2022-02-03 | Disposition: A | Discharge status: Home / Self Care | Payer: 59 | Attending: Emergency Medicine | Admitting: Emergency Medicine

## 2022-02-03 ENCOUNTER — Other Ambulatory Visit: Payer: Self-pay

## 2022-02-03 ENCOUNTER — Encounter (HOSPITAL_BASED_OUTPATIENT_CLINIC_OR_DEPARTMENT_OTHER): Payer: Self-pay | Admitting: Urology

## 2022-02-03 ENCOUNTER — Emergency Department (HOSPITAL_BASED_OUTPATIENT_CLINIC_OR_DEPARTMENT_OTHER): Payer: 59

## 2022-02-03 DIAGNOSIS — M79604 Pain in right leg: Secondary | ICD-10-CM | POA: Insufficient documentation

## 2022-02-03 NOTE — ED Notes (Signed)
ED Provider at bedside. 

## 2022-02-06 ENCOUNTER — Ambulatory Visit: Payer: 59 | Admitting: Family Medicine

## 2022-02-11 ENCOUNTER — Ambulatory Visit: Payer: 59 | Admitting: Nurse Practitioner

## 2022-02-11 ENCOUNTER — Ambulatory Visit: Payer: 59 | Admitting: Family Medicine

## 2022-02-11 ENCOUNTER — Other Ambulatory Visit: Payer: Self-pay | Admitting: Allergy

## 2022-02-11 ENCOUNTER — Encounter: Payer: Self-pay | Admitting: Nurse Practitioner

## 2022-02-11 ENCOUNTER — Ambulatory Visit (INDEPENDENT_AMBULATORY_CARE_PROVIDER_SITE_OTHER): Payer: 59

## 2022-02-11 VITALS — BP 130/84 | HR 96 | Temp 98.2°F | Wt 205.0 lb

## 2022-02-11 DIAGNOSIS — M542 Cervicalgia: Secondary | ICD-10-CM

## 2022-02-11 DIAGNOSIS — G8929 Other chronic pain: Secondary | ICD-10-CM

## 2022-02-11 DIAGNOSIS — M545 Low back pain, unspecified: Secondary | ICD-10-CM | POA: Insufficient documentation

## 2022-02-11 DIAGNOSIS — M79604 Pain in right leg: Secondary | ICD-10-CM | POA: Diagnosis not present

## 2022-02-11 MED ORDER — MELOXICAM 15 MG PO TABS: 15.0000 mg | ORAL_TABLET | Freq: Every day | ORAL | 0 refills | Status: DC

## 2022-02-11 MED ORDER — CYCLOBENZAPRINE HCL 10 MG PO TABS: 10.0000 mg | ORAL_TABLET | Freq: Three times a day (TID) | ORAL | 0 refills | Status: DC | PRN

## 2022-02-11 NOTE — Assessment & Plan Note (Signed)
Acute right leg pain from behind his knee down the lateral portion of his leg to below his ankle.  He states that he does have a meniscus tear in his left knee and is not sure if he is compensating for this with his right leg.  He went to the ER and Doppler was negative for DVT.  He states that this pain is actually getting better.  Most likely from tendinitis.  We will have him start meloxicam 15 mg daily.  Follow-up if symptoms worsen or do not improve.

## 2022-02-11 NOTE — Progress Notes (Signed)
Acute Office Visit  Subjective:     Patient ID: Alexander Hernandez, male    DOB: Dec 19, 1975, 46 y.o.   MRN: [id number]  Chief Complaint  Patient presents with   Back Pain    Pt c/o sharp lower back pain due to injury from strenuous activity x4 days. Pt states he feels the pain more on his lower left side but now it has radiated to RT side of Back.     HPI Patient is in today for pain on his right leg and knee for the past 4 days. He uses a tractor almost daily. He states that it happened about a month ago and then went away. He also has a meniscus tear on his left knee and is not sure if he is over compensating with his right leg. He went to the ER and had a venous study and was negative for DVT.   He also tweaked his lower left back on Saturday. The pain hurts worse with standing. He has been wearing a back brace which helps. He has been taking ibuprofen '600mg'$  every 8 hours. He describes the pain as sharp and he rates the pain at an 8/10. He has been taking hot showers and using a heating pad. He states the pain radiates a little down, but not into his leg.   He has been having left sided posterior neck pain for the past 2 years. He does not remember a specific injury. He describes it as a tightness and like something is "catching." He is not sure if he has a bone spur. The pain does not radiate. He states that he doesn't come to the doctor frequently and since he is bringing it up, it is really bothering him.   ROS See pertinent positives and negatives per HPI.     Objective:    BP 130/84 (BP Location: Right Arm, Cuff Size: Large)   Pulse 96   Temp 98.2 F (36.8 C) (Temporal)   Wt 205 lb (93 kg)   SpO2 99%   BMI 30.27 kg/m    Physical Exam Vitals and nursing note reviewed.  Constitutional:      Appearance: Normal appearance.  HENT:     Head: Normocephalic.  Eyes:     Conjunctiva/sclera: Conjunctivae normal.  Pulmonary:     Effort: Pulmonary effort is normal.   Musculoskeletal:        General: Tenderness present.     Cervical back: Normal range of motion.     Right lower leg: No edema.     Left lower leg: No edema.     Comments: Slight tenderness to left lower back. No swelling noted in legs. Limited lumbar ROM due to pain  Skin:    General: Skin is warm.  Neurological:     General: No focal deficit present.     Mental Status: He is alert and oriented to person, place, and time.  Psychiatric:        Mood and Affect: Mood normal.        Behavior: Behavior normal.        Thought Content: Thought content normal.        Judgment: Judgment normal.      Assessment & Plan:   Problem List Items Addressed This Visit       Other   Right leg pain    Acute right leg pain from behind his knee down the lateral portion of his leg to below his ankle.  He states  that he does have a meniscus tear in his left knee and is not sure if he is compensating for this with his right leg.  He went to the ER and Doppler was negative for DVT.  He states that this pain is actually getting better.  Most likely from tendinitis.  We will have him start meloxicam 15 mg daily.  Follow-up if symptoms worsen or do not improve.      Chronic neck pain    He has been having neck pain for the past 2 years and states that when he turns his head it feels like it will catch.  He does not remember an injury that started this.  He feels like he has stiffness on the left side of his neck.  We will check an x-ray of his neck today.  Start meloxicam 15 mg daily with food.  Stretches given for him to do daily.  He can also take Flexeril as needed for muscle tightness/spasms.  Follow-up if symptoms do not improve or worsen.      Relevant Medications   cyclobenzaprine (FLEXERIL) 10 MG tablet   meloxicam (MOBIC) 15 MG tablet   Other Relevant Orders   DG Cervical Spine Complete   Acute left-sided low back pain without sciatica - Primary    He has been having acute back pain for the past  few days after tweaking his back on Saturday.  He can continue using heat, and also use ice.  He can start meloxicam 15 mg daily with food and Flexeril 10 mg as needed.  Discussed that this may make him sleepy.  Encouraged him to take it easy for a few days with lifting, however he should still walk around.  Follow-up if symptoms worsen or do not improve.      Relevant Medications   cyclobenzaprine (FLEXERIL) 10 MG tablet   meloxicam (MOBIC) 15 MG tablet    Meds ordered this encounter  Medications   cyclobenzaprine (FLEXERIL) 10 MG tablet    Sig: Take 1 tablet (10 mg total) by mouth 3 (three) times daily as needed for muscle spasms.    Dispense:  30 tablet    Refill:  0   meloxicam (MOBIC) 15 MG tablet    Sig: Take 1 tablet (15 mg total) by mouth daily.    Dispense:  30 tablet    Refill:  0    Return if symptoms worsen or fail to improve.  Charyl Dancer, NP

## 2022-02-11 NOTE — Assessment & Plan Note (Signed)
He has been having neck pain for the past 2 years and states that when he turns his head it feels like it will catch.  He does not remember an injury that started this.  He feels like he has stiffness on the left side of his neck.  We will check an x-ray of his neck today.  Start meloxicam 15 mg daily with food.  Stretches given for him to do daily.  He can also take Flexeril as needed for muscle tightness/spasms.  Follow-up if symptoms do not improve or worsen.

## 2022-02-11 NOTE — Progress Notes (Unsigned)
Initial visit for chronic neck pain. Denies recent injury

## 2022-02-11 NOTE — Assessment & Plan Note (Signed)
He has been having acute back pain for the past few days after tweaking his back on Saturday.  He can continue using heat, and also use ice.  He can start meloxicam 15 mg daily with food and Flexeril 10 mg as needed.  Discussed that this may make him sleepy.  Encouraged him to take it easy for a few days with lifting, however he should still walk around.  Follow-up if symptoms worsen or do not improve.

## 2022-02-11 NOTE — Patient Instructions (Signed)
It was great to see you!  Start meloxicam once a day with food. This will replace the ibuprofen you are taking. You can also take flexeril, a muscle relaxer, as needed at bedtime. Keep doing ice/heat. You can also do over the counter lidocaine patches for your back such as salonpas. I have attached stretches that you can do daily.   We are also checking x-ray of your neck.   Let's follow-up if symptoms worsen or don't improve.   Take care,  Vance Peper, NP

## 2022-02-12 ENCOUNTER — Other Ambulatory Visit: Payer: Self-pay | Admitting: Allergy

## 2022-02-27 ENCOUNTER — Encounter: Payer: 59 | Admitting: Gastroenterology

## 2022-03-17 ENCOUNTER — Telehealth: Payer: Self-pay

## 2022-03-17 NOTE — Telephone Encounter (Signed)
Patient called the office because he had a deep cut from some rusted metal and wanted a Tdap. Patient was told he needed a provider in office to take a look at the cut. He was told to head to urgent care or [facility].

## 2022-03-29 ENCOUNTER — Other Ambulatory Visit: Payer: Self-pay | Admitting: Family Medicine

## 2022-03-29 ENCOUNTER — Other Ambulatory Visit: Payer: Self-pay | Admitting: Allergy

## 2022-03-30 NOTE — Telephone Encounter (Signed)
Patient is requesting a refill of the following medications: Requested Prescriptions   Pending Prescriptions Disp Refills   buPROPion (WELLBUTRIN XL) 150 MG 24 hr tablet [Pharmacy Med Name: buPROPion HCl ER (XL) 150 MG Oral Tablet Extended Release 24 Hour] 90 tablet 0    Sig: Take 1 tablet by mouth once daily    Date of patient request: 03/29/2022 Last office visit: 02/11/2022 Date of last refill: 10/02/2021 Last refill amount: 90 1 refills  Follow up time period per chart: 11/27/2022

## 2022-04-28 ENCOUNTER — Other Ambulatory Visit: Payer: Self-pay | Admitting: *Deleted

## 2022-04-28 ENCOUNTER — Telehealth: Payer: Self-pay | Admitting: Allergy

## 2022-04-28 MED ORDER — ADVAIR DISKUS 100-50 MCG/ACT IN AEPB: INHALATION_SPRAY | RESPIRATORY_TRACT | 0 refills | Status: DC

## 2022-04-28 NOTE — Telephone Encounter (Signed)
Courtesy refill has been sent in. Attempted to call patient, there was no answer and no voicemail set up. Will need to call patient back.

## 2022-04-28 NOTE — Telephone Encounter (Signed)
Patient called and made appointment for sept.28 with dr Nelva Bush and needs a refill on his advair . [facility] on [address]. [phone]

## 2022-04-29 NOTE — Telephone Encounter (Signed)
Called patient and informed of courtesy refill, patient verbalized understanding.

## 2022-05-01 NOTE — Telephone Encounter (Signed)
Printed prescription was not picked up. Prescription has been shredded. If patient needs prescription he will need a visit. Patient has not been seen since 04-2021

## 2022-05-07 ENCOUNTER — Encounter: Payer: Self-pay | Admitting: Allergy

## 2022-05-07 ENCOUNTER — Ambulatory Visit (INDEPENDENT_AMBULATORY_CARE_PROVIDER_SITE_OTHER): Payer: 59 | Admitting: Allergy

## 2022-05-07 VITALS — BP 116/86 | HR 64 | Temp 98.0°F | Resp 18 | Ht 70.0 in | Wt 210.4 lb

## 2022-05-07 DIAGNOSIS — J3089 Other allergic rhinitis: Secondary | ICD-10-CM

## 2022-05-07 DIAGNOSIS — H1013 Acute atopic conjunctivitis, bilateral: Secondary | ICD-10-CM | POA: Diagnosis not present

## 2022-05-07 DIAGNOSIS — J4540 Moderate persistent asthma, uncomplicated: Secondary | ICD-10-CM | POA: Diagnosis not present

## 2022-05-07 MED ORDER — CARBINOXAMINE MALEATE 4 MG PO TABS: 8.0000 mg | ORAL_TABLET | Freq: Two times a day (BID) | ORAL | 5 refills | Status: DC

## 2022-05-07 MED ORDER — ADVAIR DISKUS 100-50 MCG/ACT IN AEPB
INHALATION_SPRAY | RESPIRATORY_TRACT | 5 refills | Status: DC
Start: 2022-05-07 — End: 2022-10-22

## 2022-05-07 MED ORDER — MONTELUKAST SODIUM 10 MG PO TABS
10.0000 mg | ORAL_TABLET | Freq: Every day | ORAL | 5 refills | Status: DC
Start: 2022-05-07 — End: 2022-10-29

## 2022-05-07 MED ORDER — ALBUTEROL SULFATE HFA 108 (90 BASE) MCG/ACT IN AERS: 2.0000 | INHALATION_SPRAY | Freq: Four times a day (QID) | RESPIRATORY_TRACT | 1 refills | Status: DC | PRN

## 2022-05-07 NOTE — Patient Instructions (Addendum)
Allergic Rhinoconjunctivitis - Continue avoidance measures for tree pollens, weed pollens, mold, dust mites, cat, dog.   - Resume Singulair '10mg'$  daily at this time - Resume use of Carbinaxomine '8mg'$  twice a day - Can perform nasal saline rinses (Neti Pot) prior to use of medicated nasal sprays to help flush/clean the nose and sinuses and then use your medicated nasal sprays.  - For congestion/drainage use nasal Atrovent 2 sprays each nostril up to 3-4 times a day as needed - Use Pazeo 1 drop each eye as needed for itchy/watery/red eyes - for severe nasal congestion or sinus pressure can take pseudofed twice a day as needed.  Try to use sparingly - consider allergen immunotherapy (allergy shots) if medication management is not effective  Moderate persistent asthma - Continue Advair 138mg 1 puff 1-2 a day.  Discussed today during weed season to take at least once a day - Singulair as above - Have access to albuterol inhaler 2 puffs every 4-6 hours as needed for cough/wheeze/shortness of breath/chest tightness.  May use 15-20 minutes prior to activity.   Monitor frequency of use.   - You are a candidate for asthma biologic injectable medications if we need to step up your therapy.  Asthma control goals:  Full participation in all desired activities (may need albuterol before activity) Albuterol use two time or less a week on average (not counting use with activity) Cough interfering with sleep two time or less a month Oral steroids no more than once a year No hospitalizations   Follow-up 6-12 months or sooner if needed

## 2022-05-07 NOTE — Progress Notes (Signed)
Follow-up Note  RE: Alexander Hernandez MRN: [id number] DOB: Nov 12, 1975 Date of Office Visit: 05/07/2022   History of present illness: Alexander Hernandez is a 46 y.o. male presenting today for follow-up of allergic rhinitis with conjunctivitis and asthma.  He was last seen in the office on 04/24/2021 by myself.  He has not had any major health changes, surgeries or hospitalizations in the past year. He states this time of the year he has to deals with hay; he says he does this twice a year in spring and fall.  He also states his son is doing rodeo and he has to help with this activity on the weekends which involves having exposure to more hay as well as more animals.  He states during the summer months he typically does not have a lot of symptoms and is able to not have to take any of his allergy or asthma medicines.  He does note during spring and fall that he tends to have to take more of his medications which includes Singulair (which he is currently out of), [place] (which he is currently out of) as well as use of Advair.  He states his body typically lives a note when he has to take the Advair as he may have more shortness of breath.  With his allergies he can have nasal congestion and drainage, sneezing, sinus pressure.  He is on the lower dose Advair and will use it 1 puff once 2 times a day when he needs to.  I have discussed previously that he should take it at least daily during the week pollen season and probably doing tree pollen season as well.  He states he has had to use his albuterol last because he will typically use the Advair instead.  He has not required any ED or urgent care visits or any systemic steroid needs.    Review of systems: Review of Systems  Constitutional: Negative.   HENT:         See HPI  Eyes: Negative.   Respiratory: Negative.    Cardiovascular: Negative.   Musculoskeletal: Negative.   Skin: Negative.   Allergic/Immunologic: Negative.   Neurological: Negative.       All other systems negative unless noted above in HPI  Past medical/social/surgical/family history have been reviewed and are unchanged unless specifically indicated below.  No changes  Medication List: Current Outpatient Medications  Medication Sig Dispense Refill   Ascorbic Acid (VITAMIN C) 1000 MG tablet Take 1,000 mg by mouth daily.     buPROPion (WELLBUTRIN XL) 150 MG 24 hr tablet Take 1 tablet by mouth once daily 90 tablet 0   calcium carbonate (OSCAL) 1500 (600 Ca) MG TABS tablet Take 600 mg of elemental calcium by mouth daily with breakfast.     Carbinoxamine Maleate 4 MG TABS Take 2 tablets (8 mg total) by mouth in the morning and at bedtime. 60 tablet 5   Cholecalciferol (VITAMIN D) 2000 units CAPS Take 1 capsule by mouth daily.     cyclobenzaprine (FLEXERIL) 10 MG tablet Take 1 tablet (10 mg total) by mouth 3 (three) times daily as needed for muscle spasms. 30 tablet 0   magnesium gluconate (MAGONATE) 500 MG tablet Take 500 mg by mouth 2 (two) times daily.     meloxicam (MOBIC) 15 MG tablet Take 1 tablet (15 mg total) by mouth daily. 30 tablet 0   Omega-3 Fatty Acids (OMEGA-3 FISH OIL) 1200 MG CAPS Take 1 capsule by mouth daily.  OVER THE COUNTER MEDICATION BEET ROOT 1 QD     pseudoephedrine (SUDAFED 12 HOUR) 120 MG 12 hr tablet Take 1 tablet (120 mg total) by mouth 2 (two) times daily as needed (severe congestion/sinus pressure). 28 tablet 2   solifenacin (VESICARE) 10 MG tablet Take 10 mg by mouth daily.     tamsulosin (FLOMAX) 0.4 MG CAPS capsule Take 0.4 mg daily by mouth.      valACYclovir (VALTREX) 1000 MG tablet Take 1 tablet (1,000 mg total) by mouth daily. 90 tablet 3   vitamin k 100 MCG tablet Take 100 mcg by mouth daily.     ADVAIR DISKUS 100-50 MCG/ACT AEPB INHALE 1 DOSE BY MOUTH TWICE DAILY 60 each 5   albuterol (VENTOLIN HFA) 108 (90 Base) MCG/ACT inhaler Inhale 2 puffs into the lungs every 6 (six) hours as needed for wheezing or shortness of breath. 18 g 1    Carbinoxamine Maleate ER [facility]) 4 MG/5ML SUER Take 8 mg by mouth 2 (two) times daily. (Patient not taking: Reported on 05/07/2022) 280 mL 3   fluticasone (FLONASE) 50 MCG/ACT nasal spray Place 1 spray into both nostrils daily. (Patient not taking: Reported on 05/07/2022) 16 g 5   montelukast (SINGULAIR) 10 MG tablet Take 1 tablet (10 mg total) by mouth at bedtime. 30 tablet 5   pantoprazole (PROTONIX) 20 MG tablet Take 1 tablet (20 mg total) by mouth daily. (Patient not taking: Reported on 05/07/2022) 30 tablet 0   No current facility-administered medications for this visit.     Known medication allergies: Allergies  Allergen Reactions   Codeine Other (See Comments)    REACTION: skin irritation, pains, tenderness to touch   Other Other (See Comments)    Narcotics-prefers not to take meds     Physical examination: Blood pressure 116/86, pulse 64, temperature 98 F (36.7 C), temperature source Temporal, resp. rate 18, height '5\' 10"'$  (1.778 m), weight 210 lb 6.4 oz (95.4 kg), SpO2 95 %.  General: Alert, interactive, in no acute distress. HEENT: PERRLA, TMs pearly gray, turbinates minimally edematous without discharge, post-pharynx non erythematous. Neck: Supple without lymphadenopathy. Lungs: Clear to auscultation without wheezing, rhonchi or rales. {no increased work of breathing. CV: Normal S1, S2 without murmurs. Abdomen: Nondistended, nontender. Skin: Warm and dry, without lesions or rashes. Extremities:  No clubbing, cyanosis or edema. Neuro:   Grossly intact.  Diagnositics/Labs:  Spirometry: FEV1: 3.0L 75%, FVC: 4.13L 82%, ratio consistent with essentially nonobstructive pattern  Assessment and plan:   Allergic Rhinoconjunctivitis - Continue avoidance measures for tree pollens, weed pollens, mold, dust mites, cat, dog.   - Resume Singulair '10mg'$  daily at this time - Resume use of Carbinaxomine '8mg'$  twice a day - Can perform nasal saline rinses (Neti Pot) prior to use of  medicated nasal sprays to help flush/clean the nose and sinuses and then use your medicated nasal sprays.  - For congestion/drainage use nasal Atrovent 2 sprays each nostril up to 3-4 times a day as needed - Use Pazeo 1 drop each eye as needed for itchy/watery/red eyes - for severe nasal congestion or sinus pressure can take pseudofed twice a day as needed.  Try to use sparingly - consider allergen immunotherapy (allergy shots) if medication management is not effective  Moderate persistent asthma - Continue Advair 170mg 1 puff 1-2 a day.  Discussed today during weed season to take at least once a day - Singulair as above - Have access to albuterol inhaler 2 puffs every 4-6 hours  as needed for cough/wheeze/shortness of breath/chest tightness.  May use 15-20 minutes prior to activity.   Monitor frequency of use.   - You are a candidate for asthma biologic injectable medications if we need to step up your therapy.  Asthma control goals:  Full participation in all desired activities (may need albuterol before activity) Albuterol use two time or less a week on average (not counting use with activity) Cough interfering with sleep two time or less a month Oral steroids no more than once a year No hospitalizations   Follow-up 6-12 months or sooner if needed  No follow-ups on file.  I appreciate the opportunity to take part in Ballard's care. Please do not hesitate to contact me with questions.  Sincerely,   Prudy Feeler, MD Allergy/Immunology [facility]

## 2022-05-08 ENCOUNTER — Other Ambulatory Visit: Payer: Self-pay

## 2022-05-08 MED ORDER — ALBUTEROL SULFATE HFA 108 (90 BASE) MCG/ACT IN AERS: 2.0000 | INHALATION_SPRAY | RESPIRATORY_TRACT | 5 refills | Status: AC | PRN

## 2022-07-19 ENCOUNTER — Other Ambulatory Visit: Payer: Self-pay | Admitting: Family Medicine

## 2022-08-24 ENCOUNTER — Other Ambulatory Visit (HOSPITAL_COMMUNITY): Payer: Self-pay

## 2022-08-24 ENCOUNTER — Telehealth: Payer: Self-pay

## 2022-08-24 NOTE — Telephone Encounter (Signed)
PA request received via CMM for Advair Diskus 100-50MCG/ACT aerosol powder  Key: [id number]  PA not submitted due to generic being covered per test claim.  Nothing further needed.

## 2022-09-09 ENCOUNTER — Other Ambulatory Visit: Payer: Self-pay | Admitting: *Deleted

## 2022-09-09 MED ORDER — FLUTICASONE-SALMETEROL 100-50 MCG/ACT IN AEPB: 1.0000 | INHALATION_SPRAY | Freq: Two times a day (BID) | RESPIRATORY_TRACT | 5 refills | Status: AC

## 2022-09-09 NOTE — Telephone Encounter (Signed)
Pharmacy called to see if generic could be dispensed as the advair has been discontinued.   Best contact number for pharmacy: [phone]

## 2022-09-09 NOTE — Telephone Encounter (Signed)
Generic has been sent in to pharmacy.

## 2022-10-03 ENCOUNTER — Encounter (HOSPITAL_BASED_OUTPATIENT_CLINIC_OR_DEPARTMENT_OTHER): Payer: Self-pay | Admitting: Emergency Medicine

## 2022-10-03 ENCOUNTER — Emergency Department (HOSPITAL_BASED_OUTPATIENT_CLINIC_OR_DEPARTMENT_OTHER): Payer: 59

## 2022-10-03 ENCOUNTER — Other Ambulatory Visit: Payer: Self-pay

## 2022-10-03 ENCOUNTER — Emergency Department (HOSPITAL_BASED_OUTPATIENT_CLINIC_OR_DEPARTMENT_OTHER)
Admission: EM | Admit: 2022-10-03 | Discharge: 2022-10-04 | Disposition: A | Discharge status: Home / Self Care | Payer: 59 | Attending: Emergency Medicine | Admitting: Emergency Medicine

## 2022-10-03 DIAGNOSIS — W208XXA Other cause of strike by thrown, projected or falling object, initial encounter: Secondary | ICD-10-CM | POA: Insufficient documentation

## 2022-10-03 DIAGNOSIS — S92015A Nondisplaced fracture of body of left calcaneus, initial encounter for closed fracture: Secondary | ICD-10-CM | POA: Diagnosis not present

## 2022-10-03 DIAGNOSIS — S92212A Displaced fracture of cuboid bone of left foot, initial encounter for closed fracture: Secondary | ICD-10-CM | POA: Insufficient documentation

## 2022-10-03 DIAGNOSIS — M7989 Other specified soft tissue disorders: Secondary | ICD-10-CM | POA: Diagnosis not present

## 2022-10-03 DIAGNOSIS — S99922A Unspecified injury of left foot, initial encounter: Secondary | ICD-10-CM | POA: Diagnosis present

## 2022-10-03 DIAGNOSIS — S92002A Unspecified fracture of left calcaneus, initial encounter for closed fracture: Secondary | ICD-10-CM

## 2022-10-03 MED ORDER — KETOROLAC TROMETHAMINE 60 MG/2ML IM SOLN
30.0000 mg | Freq: Once | INTRAMUSCULAR | Status: AC
  Administered 2022-10-03: 30 mg via INTRAMUSCULAR
  Filled 2022-10-03: qty 2

## 2022-10-03 NOTE — ED Triage Notes (Signed)
Patient states that he had a rodeo metal gate fall on his left foot.  He states one gate hit the back of his ankle and jammed his foot into another gate.  Patient still has cowboy boots on, swelling is obvious in the boot compared to other foot.

## 2022-10-04 ENCOUNTER — Emergency Department (HOSPITAL_BASED_OUTPATIENT_CLINIC_OR_DEPARTMENT_OTHER): Payer: 59

## 2022-10-04 MED ORDER — DICLOFENAC SODIUM ER 100 MG PO TB24: 100.0000 mg | ORAL_TABLET | Freq: Every day | ORAL | 0 refills | Status: DC

## 2022-10-04 NOTE — ED Provider Notes (Signed)
[place] [facility] Provider Note   CSN: [id number] Arrival date & time: 10/03/22  2137     History  Chief Complaint  Patient presents with   Foot Injury    Alexander Hernandez is a 47 y.o. male.  The history is provided by the patient and the spouse.  Foot Injury Location:  Foot Time since incident: hours. Injury: yes   Mechanism of injury comment:  Slammed foot in a 500 lb gate at a rodeo Foot location:  L foot Pain details:    Radiates to:  Does not radiate   Severity:  Severe   Onset quality:  Sudden   Timing:  Constant   Progression:  Unchanged Chronicity:  New Dislocation: no   Relieved by:  Nothing Worsened by:  Nothing Ineffective treatments:  None tried Associated symptoms: no back pain and no fever   Risk factors: no concern for non-accidental trauma   Patient was helping out a rodeo and gate closed and slammed into patient's left lateral foot causing pain and swelling.       Home Medications Prior to Admission medications   Medication Sig Start Date End Date Taking? Authorizing Provider  Diclofenac Sodium CR 100 MG 24 hr tablet Take 1 tablet (100 mg total) by mouth daily. 10/04/22  Yes Mayling Aber, MD  ADVAIR DISKUS 100-50 MCG/ACT AEPB INHALE 1 DOSE BY MOUTH TWICE DAILY 05/07/22   Kennith Gain, MD  albuterol (PROVENTIL HFA) 108 (90 Base) MCG/ACT inhaler Inhale 2 puffs into the lungs every 4 (four) hours as needed for wheezing or shortness of breath. 05/08/22   Padgett, Rae Halsted, MD  albuterol (VENTOLIN HFA) 108 (90 Base) MCG/ACT inhaler Inhale 2 puffs into the lungs every 6 (six) hours as needed for wheezing or shortness of breath. 05/07/22   Kennith Gain, MD  Ascorbic Acid (VITAMIN C) 1000 MG tablet Take 1,000 mg by mouth daily.    [provider]  buPROPion (WELLBUTRIN XL) 150 MG 24 hr tablet Take 1 tablet by mouth once daily 07/20/22   Midge Minium, MD  calcium carbonate  (OSCAL) 1500 (600 Ca) MG TABS tablet Take 600 mg of elemental calcium by mouth daily with breakfast.    [provider]  Carbinoxamine Maleate 4 MG TABS Take 2 tablets (8 mg total) by mouth in the morning and at bedtime. 05/07/22   Kennith Gain, MD  Carbinoxamine Maleate ER [facility] ER) 4 MG/5ML SUER Take 8 mg by mouth 2 (two) times daily. Patient not taking: Reported on 05/07/2022 04/24/21   Kennith Gain, MD  Cholecalciferol (VITAMIN D) 2000 units CAPS Take 1 capsule by mouth daily.    [provider]  cyclobenzaprine (FLEXERIL) 10 MG tablet Take 1 tablet (10 mg total) by mouth 3 (three) times daily as needed for muscle spasms. 02/11/22   McElwee, Lauren A, NP  fluticasone (FLONASE) 50 MCG/ACT nasal spray Place 1 spray into both nostrils daily. Patient not taking: Reported on 05/07/2022 02/09/19   Kennith Gain, MD  fluticasone-salmeterol (ADVAIR) 100-50 MCG/ACT AEPB Inhale 1 puff into the lungs 2 (two) times daily. 09/09/22   Kennith Gain, MD  magnesium gluconate (MAGONATE) 500 MG tablet Take 500 mg by mouth 2 (two) times daily.    [provider]  meloxicam (MOBIC) 15 MG tablet Take 1 tablet (15 mg total) by mouth daily. 02/11/22   McElwee, Lauren A, NP  montelukast (SINGULAIR) 10 MG tablet Take 1 tablet (10  mg total) by mouth at bedtime. 05/07/22   Kennith Gain, MD  Omega-3 Fatty Acids (OMEGA-3 FISH OIL) 1200 MG CAPS Take 1 capsule by mouth daily.    [provider]  OVER THE COUNTER MEDICATION BEET ROOT 1 QD    [provider]  pantoprazole (PROTONIX) 20 MG tablet Take 1 tablet (20 mg total) by mouth daily. Patient not taking: Reported on 05/07/2022 07/13/17   Charlesetta Shanks, MD  pseudoephedrine (SUDAFED 12 HOUR) 120 MG 12 hr tablet Take 1 tablet (120 mg total) by mouth 2 (two) times daily as needed (severe congestion/sinus pressure). 06/26/21   Kennith Gain, MD  solifenacin (VESICARE)  10 MG tablet Take 10 mg by mouth daily. 08/07/20   [provider]  tamsulosin (FLOMAX) 0.4 MG CAPS capsule Take 0.4 mg daily by mouth.     [provider]  valACYclovir (VALTREX) 1000 MG tablet Take 1 tablet (1,000 mg total) by mouth daily. 11/25/21   Midge Minium, MD  vitamin k 100 MCG tablet Take 100 mcg by mouth daily.    [provider]      Allergies    Codeine and Other    Review of Systems   Review of Systems  Constitutional:  Negative for fever.  HENT:  Negative for facial swelling.   Respiratory:  Negative for wheezing and stridor.   Gastrointestinal:  Negative for vomiting.  Musculoskeletal:  Positive for arthralgias and joint swelling. Negative for back pain.  All other systems reviewed and are negative.   Physical Exam Updated Vital Signs BP (!) 135/94   Pulse 83   Temp 98.4 F (36.9 C) (Oral)   Resp 14   SpO2 98%  Physical Exam Vitals and nursing note reviewed.  Constitutional:      General: He is not in acute distress.    Appearance: Normal appearance. He is well-developed. He is not diaphoretic.  HENT:     Head: Normocephalic and atraumatic.     Nose: Nose normal.  Eyes:     Conjunctiva/sclera: Conjunctivae normal.     Pupils: Pupils are equal, round, and reactive to light.  Cardiovascular:     Rate and Rhythm: Normal rate and regular rhythm.     Pulses: Normal pulses.     Heart sounds: Normal heart sounds.  Pulmonary:     Effort: Pulmonary effort is normal.     Breath sounds: Normal breath sounds. No wheezing or rales.  Abdominal:     General: Bowel sounds are normal.     Palpations: Abdomen is soft.     Tenderness: There is no abdominal tenderness. There is no guarding or rebound.  Musculoskeletal:     Cervical back: Normal range of motion and neck supple.     Right ankle:     Right Achilles Tendon: Normal.     Left ankle: No swelling. Normal range of motion.     Left Achilles Tendon: Normal.     Right foot:  Normal.     Left foot: Normal capillary refill. Swelling, tenderness and bony tenderness present. No Charcot foot, foot drop, laceration or crepitus. Normal pulse.       Legs:  Skin:    General: Skin is warm and dry.  Neurological:     Mental Status: He is alert and oriented to person, place, and time.     ED Results / Procedures / Treatments   Labs (all labs ordered are listed, but only abnormal results are displayed) Labs Reviewed -  No data to display  EKG None  Radiology CT Foot Left Wo Contrast  Result Date: 10/04/2022 CLINICAL DATA:  Foot pain.  Stress fracture suspected. EXAM: CT OF THE LEFT FOOT WITHOUT CONTRAST TECHNIQUE: Multidetector CT imaging of the left foot was performed according to the standard protocol. Multiplanar CT image reconstructions were also generated. RADIATION DOSE REDUCTION: This exam was performed according to the departmental dose-optimization program which includes automated exposure control, adjustment of the mA and/or kV according to patient size and/or use of iterative reconstruction technique. COMPARISON:  Foot radiographs 10/03/2022 FINDINGS: Bones/Joint/Cartilage Acute mildly displaced and comminuted fracture of the anterolateral calcaneus. There is approximately 3 mm of impaction. The fracture extends into the calcaneocuboid joint. Additional tiny osseous fragment about the anterior process of the calcaneus (6/89). Tiny minimally displaced fracture of the medial aspect of the posterosuperior cuboid. Ligaments Suboptimally assessed by CT. Muscles and Tendons Unremarkable CT appearance. Soft tissues Soft tissue edema about the calcaneus. IMPRESSION: 1. Acute mildly displaced and comminuted fracture of the anterolateral calcaneus with extension into the calcaneocuboid joint. 2. Tiny minimally displaced fracture of the medial aspect of the posterosuperior cuboid. Electronically Signed   By: Placido Sou M.D.   On: 10/04/2022 01:06   DG Foot Complete  Left  Result Date: 10/03/2022 CLINICAL DATA:  Pain, swelling, injury. Metal gate fell on foot. EXAM: LEFT FOOT - COMPLETE 3+ VIEW COMPARISON:  None Available. FINDINGS: Cortical irregularity about the lateral aspect of the calcaneus is suspicious for nondisplaced fracture. There is adjacent lateral and dorsal soft tissue edema. No additional fracture of the foot. Alignment is maintained, no dislocation. IMPRESSION: Cortical irregularity about the lateral aspect of the calcaneus is suspicious for nondisplaced fracture. Recommend correlation with point tenderness. Electronically Signed   By: Keith Rake M.D.   On: 10/03/2022 22:12    Procedures Procedures    Medications Ordered in ED Medications  ketorolac (TORADOL) injection 30 mg (30 mg Intramuscular Given 10/03/22 2355)    ED Course/ Medical Decision Making/ A&P                             Medical Decision Making Patient closed foot in heavy gate at a rodeo this evening   Amount and/or Complexity of Data Reviewed Independent Historian: spouse    Details: See above  External Data Reviewed: notes.    Details: Previous notes reviewed  Radiology: ordered and independent interpretation performed.    Details: Chip fracture of calcaneus on XR by me  Discussion of management or test interpretation with external provider(s): Case d/w Dr. Sammuel Hines, cam walker crutches non weight bearing and CT.  Please follow up in the office for ongoing care   Risk Prescription drug management. Risk Details: Pants removed prior to cam walked being placed.  Patient and wife state patient will not take narcotic pain medication.  Patient states he does not like the way it makes him feel.  I will prescribe voltaren XR for pain.  Cam walker placed in the ED crutches fitted in the ED.  Ice instructions given.  Stable for discharge.  Strict return     Final Clinical Impression(s) / ED Diagnoses Final diagnoses:  Closed nondisplaced fracture of left  calcaneus, unspecified portion of calcaneus, initial encounter  Closed displaced fracture of cuboid of left foot, initial encounter   Return for intractable cough, coughing up blood, fevers > 100.4 unrelieved by medication, shortness of breath, intractable vomiting, chest pain, shortness  of breath, weakness, numbness, changes in speech, facial asymmetry, abdominal pain, passing out, Inability to tolerate liquids or food, cough, altered mental status or any concerns. No signs of systemic illness or infection. The patient is nontoxic-appearing on exam and vital signs are within normal limits.  I have reviewed the triage vital signs and the nursing notes. Pertinent labs & imaging results that were available during my care of the patient were reviewed by me and considered in my medical decision making (see chart for details). After history, exam, and medical workup I feel the patient has been appropriately medically screened and is safe for discharge home. Pertinent diagnoses were discussed with the patient. Patient was given return precautions Rx / DC Orders ED Discharge Orders          Ordered    Diclofenac Sodium CR 100 MG 24 hr tablet  Daily        10/04/22 0116              Rhythm Wigfall, MD 10/04/22 [phone]

## 2022-10-22 ENCOUNTER — Encounter (HOSPITAL_COMMUNITY): Payer: Self-pay

## 2022-10-22 ENCOUNTER — Other Ambulatory Visit (HOSPITAL_COMMUNITY): Payer: Self-pay | Admitting: Orthopaedic Surgery

## 2022-10-22 ENCOUNTER — Other Ambulatory Visit (HOSPITAL_COMMUNITY): Payer: Self-pay | Admitting: Family Medicine

## 2022-10-22 ENCOUNTER — Ambulatory Visit (HOSPITAL_COMMUNITY)
Admission: RE | Admit: 2022-10-22 | Discharge: 2022-10-22 | Disposition: A | Discharge status: Home / Self Care | Payer: 59 | Source: Ambulatory Visit | Attending: Internal Medicine | Admitting: Internal Medicine

## 2022-10-22 ENCOUNTER — Ambulatory Visit (HOSPITAL_BASED_OUTPATIENT_CLINIC_OR_DEPARTMENT_OTHER)
Admission: RE | Admit: 2022-10-22 | Discharge: 2022-10-22 | Disposition: A | Discharge status: Home / Self Care | Payer: 59 | Source: Ambulatory Visit | Attending: Vascular Surgery | Admitting: Vascular Surgery

## 2022-10-22 ENCOUNTER — Other Ambulatory Visit (HOSPITAL_COMMUNITY): Payer: Self-pay

## 2022-10-22 VITALS — BP 135/106 | HR 78

## 2022-10-22 DIAGNOSIS — M79605 Pain in left leg: Secondary | ICD-10-CM | POA: Insufficient documentation

## 2022-10-22 DIAGNOSIS — I82452 Acute embolism and thrombosis of left peroneal vein: Secondary | ICD-10-CM | POA: Insufficient documentation

## 2022-10-22 MED ORDER — APIXABAN (ELIQUIS) VTE STARTER PACK (10MG AND 5MG)
ORAL_TABLET | ORAL | 0 refills | Status: DC
  Filled 2022-10-22: qty 74, 28d supply, fill #0

## 2022-10-22 MED ORDER — APIXABAN 5 MG PO TABS: 5.0000 mg | ORAL_TABLET | Freq: Two times a day (BID) | ORAL | 1 refills | Status: DC

## 2022-10-22 NOTE — Patient Instructions (Addendum)
-  Start apixaban (Eliquis) 10 mg twice daily for 7 days followed by 5 mg twice daily. -Your refills have been sent to your [facility]. You will likely need to call the pharmacy to ask them to fill this when you start to run low on your current supply.  -It is important to take your medication around the same time every day.  -Avoid NSAIDs like ibuprofen (Advil, Motrin) and naproxen (Aleve) as well as aspirin doses over 100 mg daily. -Tylenol (acetaminophen) is the preferred over the counter pain medication to lower the risk of bleeding. -Be sure to alert all of your health care providers that you are taking an anticoagulant prior to starting a new medication or having a procedure. -Monitor for signs and symptoms of bleeding (abnormal bruising, prolonged bleeding, nose bleeds, bleeding from gums, discolored urine, black tarry stools). If you have fallen and hit your head OR if your bleeding is severe or not stopping, seek emergency care.  -Go to the emergency room if emergent signs and symptoms of new clot occur (new or worse swelling and pain in an arm or leg, shortness of breath, chest pain, fast or irregular heartbeats, lightheadedness, dizziness, fainting, coughing up blood) or if you experience a significant color change (pale or blue) in the extremity that has the DVT.  -We recommend you wear compression stockings as long as you are having swelling or pain. Be sure to purchase the correct size and take them off at night.   Your next visit will be in 3 months. I will call to make this appt with you. [facility] [place], [place], [place] [address] Enter the hospital through [address] off [address] and pull up to the [place] entrance to the free [place] parking.  Check in for your appointment at the [facility].   If you have any questions or need to reschedule an appointment, please call [phone] [employer]  If you are having an  emergency, call 911 or present to the nearest emergency room.   What is a DVT?  -Deep vein thrombosis (DVT) is a condition in which a blood clot forms in a vein of the deep venous system which can occur in the lower leg, thigh, pelvis, arm, or neck. This condition is serious and can be life-threatening if the clot travels to the arteries of the lungs and causing a blockage (pulmonary embolism, PE). A DVT can also damage veins in the leg, which can lead to long-term venous disease, leg pain, swelling, discoloration, and ulcers or sores (post-thrombotic syndrome).  -Treatment may include taking an anticoagulant medication to prevent more clots from forming and the current clot from growing, wearing compression stockings, and/or surgical procedures to remove or dissolve the clot.
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
## 2022-10-29 ENCOUNTER — Encounter: Payer: Self-pay | Admitting: Family Medicine

## 2022-10-29 ENCOUNTER — Ambulatory Visit: Payer: 59 | Admitting: Family Medicine

## 2022-10-29 VITALS — BP 128/84 | HR 85 | Temp 98.5°F | Resp 17 | Ht 70.0 in | Wt 202.1 lb

## 2022-10-29 DIAGNOSIS — H1010 Acute atopic conjunctivitis, unspecified eye: Secondary | ICD-10-CM

## 2022-10-29 DIAGNOSIS — I82452 Acute embolism and thrombosis of left peroneal vein: Secondary | ICD-10-CM | POA: Diagnosis not present

## 2022-10-29 DIAGNOSIS — J309 Allergic rhinitis, unspecified: Secondary | ICD-10-CM

## 2022-10-29 MED ORDER — FLUTICASONE PROPIONATE 50 MCG/ACT NA SUSP: 1.0000 | Freq: Every day | NASAL | 5 refills | Status: AC

## 2022-10-29 MED ORDER — MONTELUKAST SODIUM 10 MG PO TABS: 10.0000 mg | ORAL_TABLET | Freq: Every day | ORAL | 1 refills | Status: DC

## 2022-10-29 MED ORDER — KARBINAL ER 4 MG/5ML PO SUER: 8.0000 mg | Freq: Two times a day (BID) | ORAL | 3 refills | Status: AC

## 2022-10-29 NOTE — Assessment & Plan Note (Signed)
New.  Reviewed ER note from 2/24 and pharmacy note from 3/14.  Agree that this was a provoked DVT and should only require 3-6 months of anticoagulation.  No concerns for a clotting disorder in this situation.  Told him that swelling is to be expected until the clots resolve.  Discussed need to avoid NSAIDs while on anticoagulation.  Will continue to follow along.

## 2022-10-29 NOTE — Patient Instructions (Addendum)
Follow up as needed or as scheduled Take the Eliquis as directed for the next 3 months RESTART your allergy medications as directed Drink LOTS of water to help w/ congestion and swelling Call with any questions or concerns Hang in there!!  Things will improve!!

## 2022-10-29 NOTE — Progress Notes (Signed)
   Subjective:    Patient ID: Alexander Hernandez, male    DOB: 1976-04-10, 47 y.o.   MRN: [id number]  HPI 'i've been sick since the day after surgery'- pt reports he has had terrible cough x3 weeks.  + HA.  Bilateral ear fullness.  + nasal congestion.  No known fever.  'it feels like I have the flu'- this has been going on for 3 weeks.  Cough is improving.    Blood clots- pt broke his L foot on 2/24.  Had surgery 2/29- w/ Dr Lucia Gaskins.  Had pain and swelling after surgery but didn't think much about it.  Flew to and from [place] (flight home on 3/12) and had worsening swelling.  Cast removed at Ortho w/ concern for DVT.  [place] did show DVT of L peroneal and posterior tibial veins.  Started on Eliquis for 3 months on 3/14.  Pt reports HA's and nausea since starting Eliquis   Review of Systems For ROS see HPI     Objective:   Physical Exam Vitals reviewed.  Constitutional:      General: He is not in acute distress.    Appearance: Normal appearance. He is well-developed. He is not ill-appearing.  HENT:     Head: Normocephalic and atraumatic.     Right Ear: Tympanic membrane and ear canal normal.     Left Ear: Tympanic membrane and ear canal normal.     Nose: Congestion present. No rhinorrhea.     Mouth/Throat:     Pharynx: Posterior oropharyngeal erythema (copious PND) present. No oropharyngeal exudate.  Eyes:     Conjunctiva/sclera: Conjunctivae normal.     Pupils: Pupils are equal, round, and reactive to light.  Cardiovascular:     Rate and Rhythm: Normal rate and regular rhythm.     Heart sounds: Normal heart sounds.  Pulmonary:     Effort: Pulmonary effort is normal. No respiratory distress.     Breath sounds: Normal breath sounds. No wheezing.  Musculoskeletal:     Cervical back: Normal range of motion and neck supple.     Right lower leg: No edema.     Left lower leg: Edema present.  Lymphadenopathy:     Cervical: No cervical adenopathy.  Skin:    General: Skin is warm and dry.   Neurological:     General: No focal deficit present.     Mental Status: He is alert and oriented to person, place, and time.  Psychiatric:        Mood and Affect: Mood normal.        Behavior: Behavior normal.        Thought Content: Thought content normal.           Assessment & Plan:

## 2022-10-29 NOTE — Assessment & Plan Note (Signed)
Deteriorated.  Pt is not sick w/ a bacterial or viral infxn at this time.  He likely started w/ a viral illness 3 weeks ago when 2 other family members had similar sxs but current sxs are due to untreated allergies.  Will restart the medications that Dr Nelva Bush had him taking.  Encouraged him to follow up as this is a difficult time of year for him.

## 2022-11-18 ENCOUNTER — Other Ambulatory Visit: Payer: Self-pay | Admitting: Family Medicine

## 2022-11-27 ENCOUNTER — Encounter: Payer: 59 | Admitting: Family Medicine

## 2022-11-27 ENCOUNTER — Other Ambulatory Visit: Payer: Self-pay | Admitting: Family Medicine

## 2022-12-17 ENCOUNTER — Ambulatory Visit (INDEPENDENT_AMBULATORY_CARE_PROVIDER_SITE_OTHER): Payer: 59 | Admitting: Family Medicine

## 2022-12-17 ENCOUNTER — Encounter: Payer: Self-pay | Admitting: Family Medicine

## 2022-12-17 VITALS — BP 118/82 | HR 92 | Temp 98.1°F | Resp 19 | Ht 70.0 in | Wt 204.1 lb

## 2022-12-17 DIAGNOSIS — E663 Overweight: Secondary | ICD-10-CM | POA: Diagnosis not present

## 2022-12-17 DIAGNOSIS — E559 Vitamin D deficiency, unspecified: Secondary | ICD-10-CM

## 2022-12-17 DIAGNOSIS — Z0000 Encounter for general adult medical examination without abnormal findings: Secondary | ICD-10-CM | POA: Diagnosis not present

## 2022-12-17 NOTE — Assessment & Plan Note (Signed)
Check labs and replete prn. 

## 2022-12-17 NOTE — Progress Notes (Signed)
   Subjective:    Patient ID: Alexander Hernandez, male    DOB: [birth year], 47 y.o.   MRN: [id number]  HPI CPE- due for colonoscopy, pt plans to schedule.  UTD on Tdap.  Patient Care Team    Relationship Specialty Notifications Start End  Sheliah Hatch, MD PCP - General Family Medicine  06/22/11     Health Maintenance  Topic Date Due   COLONOSCOPY (Pts 45-69yrs Insurance coverage will need to be confirmed)  Never done   INFLUENZA VACCINE  03/11/2023   DTaP/Tdap/Td (4 - Td or Tdap) 01/28/2024   Hepatitis C Screening  Completed   HIV Screening  Completed   HPV VACCINES  Aged Out   COVID-19 Vaccine  Discontinued      Review of Systems Patient reports no vision/hearing changes, anorexia, fever ,adenopathy, persistant/recurrent hoarseness, swallowing issues, chest pain, palpitations, edema, persistant/recurrent cough, hemoptysis, dyspnea (rest,exertional, paroxysmal nocturnal), gastrointestinal  bleeding (melena, rectal bleeding), abdominal pain, excessive heart burn, GU symptoms (dysuria, hematuria, voiding/incontinence issues) syncope, focal weakness, memory loss, numbness & tingling, skin/hair/nail changes, depression, anxiety, abnormal bruising/bleeding, musculoskeletal symptoms/signs.     Objective:   Physical Exam General Appearance:    Alert, cooperative, no distress, appears stated age  Head:    Normocephalic, without obvious abnormality, atraumatic  Eyes:    PERRL, conjunctiva/corneas clear, EOM's intact both eyes       Ears:    Normal TM's and external ear canals, both ears  Nose:   Nares normal, septum midline, mucosa normal, no drainage   or sinus tenderness  Throat:   Lips, mucosa, and tongue normal; teeth and gums normal  Neck:   Supple, symmetrical, trachea midline, no adenopathy;       thyroid:  No enlargement/tenderness/nodules  Back:     Symmetric, no curvature, ROM normal, no CVA tenderness  Lungs:     Clear to auscultation bilaterally, respirations unlabored   Chest wall:    No tenderness or deformity  Heart:    Regular rate and rhythm, S1 and S2 normal, no murmur, rub   or gallop  Abdomen:     Soft, non-tender, bowel sounds active all four quadrants,    no masses, no organomegaly  Genitalia:    deferred  Rectal:    Extremities:   Extremities normal, atraumatic, no cyanosis or edema  Pulses:   2+ and symmetric all extremities  Skin:   Skin color, texture, turgor normal, no rashes or lesions  Lymph nodes:   Cervical, supraclavicular, and axillary nodes normal  Neurologic:   CNII-XII intact. Normal strength, sensation and reflexes      throughout          Assessment & Plan:

## 2022-12-17 NOTE — Patient Instructions (Signed)
Follow up in 1 year or as needed We'll notify you of your lab results and make any changes if needed Keep up the good work on healthy diet and regular exercise- you're doing great!! Call and schedule your colonoscopy at your convenience Call with any questions or concerns Stay Safe!  Stay Healthy! Have a great summer!!!

## 2022-12-17 NOTE — Assessment & Plan Note (Signed)
Pt's PE WNL w/ exception of BMI.  UTD on Tdap.  Due for colonoscopy- pt to schedule.  Check labs.  Anticipatory guidance provided.

## 2022-12-17 NOTE — Assessment & Plan Note (Signed)
Encouraged healthy diet and regular exercise.  Check labs to risk stratify.  Will follow. ?

## 2022-12-18 LAB — TSH: TSH: 1.21 u[IU]/mL (ref 0.35–5.50)

## 2022-12-18 LAB — BASIC METABOLIC PANEL
BUN: 14 mg/dL (ref 6–23)
CO2: 31 mEq/L (ref 19–32)
Calcium: 9.4 mg/dL (ref 8.4–10.5)
Chloride: 105 mEq/L (ref 96–112)
Creatinine, Ser: 1.11 mg/dL (ref 0.40–1.50)
GFR: 78.19 mL/min (ref >60.00)
Glucose, Bld: 72 mg/dL (ref 70–99)
Potassium: 4 mEq/L (ref 3.5–5.1)
Sodium: 141 mEq/L (ref 135–145)

## 2022-12-18 LAB — VITAMIN D 25 HYDROXY (VIT D DEFICIENCY, FRACTURES): VITD: 51.98 ng/mL (ref 30.00–100.00)

## 2022-12-18 LAB — CBC WITH DIFFERENTIAL/PLATELET
Basophils Absolute: 0.1 10*3/uL (ref 0.0–0.1)
Basophils Relative: 1 % (ref 0.0–3.0)
Eosinophils Absolute: 1 10*3/uL — ABNORMAL HIGH (ref 0.0–0.7)
Eosinophils Relative: 13.1 % — ABNORMAL HIGH (ref 0.0–5.0)
HCT: 44.4 % (ref 39.0–52.0)
Hemoglobin: 15.1 g/dL (ref 13.0–17.0)
Lymphocytes Relative: 21 % (ref 12.0–46.0)
Lymphs Abs: 1.6 10*3/uL (ref 0.7–4.0)
MCHC: 34 g/dL (ref 30.0–36.0)
MCV: 86.6 fl (ref 78.0–100.0)
Monocytes Absolute: 0.8 10*3/uL (ref 0.1–1.0)
Monocytes Relative: 10.4 % (ref 3.0–12.0)
Neutro Abs: 4.1 10*3/uL (ref 1.4–7.7)
Neutrophils Relative %: 54.5 % (ref 43.0–77.0)
Platelets: 240 10*3/uL (ref 150.0–400.0)
RBC: 5.13 Mil/uL (ref 4.22–5.81)
RDW: 13.2 % (ref 11.5–15.5)
WBC: 7.5 10*3/uL (ref 4.0–10.5)

## 2022-12-18 LAB — LIPID PANEL
Cholesterol: 175 mg/dL (ref 0–200)
HDL: 39.8 mg/dL (ref >39.00)
LDL Cholesterol: 97 mg/dL (ref 0–99)
NonHDL: 135.05
Total CHOL/HDL Ratio: 4
Triglycerides: 192 mg/dL — ABNORMAL HIGH (ref 0.0–149.0)
VLDL: 38.4 mg/dL (ref 0.0–40.0)

## 2022-12-18 LAB — HEPATIC FUNCTION PANEL
ALT: 38 U/L (ref 0–53)
AST: 27 U/L (ref 0–37)
Albumin: 4.2 g/dL (ref 3.5–5.2)
Alkaline Phosphatase: 70 U/L (ref 39–117)
Bilirubin, Direct: 0.1 mg/dL (ref 0.0–0.3)
Total Bilirubin: 0.5 mg/dL (ref 0.2–1.2)
Total Protein: 7.2 g/dL (ref 6.0–8.3)

## 2022-12-21 ENCOUNTER — Telehealth: Payer: Self-pay

## 2022-12-21 NOTE — Telephone Encounter (Signed)
Patient called back and was informed.

## 2022-12-21 NOTE — Telephone Encounter (Signed)
Attempted to reach pt no answer and VM is full  

## 2022-12-21 NOTE — Telephone Encounter (Signed)
-----   Message from Sheliah Hatch, MD sent at 12/21/2022  8:42 AM EDT ----- Labs look great!  No changes at this time

## 2022-12-22 ENCOUNTER — Telehealth (HOSPITAL_COMMUNITY): Payer: Self-pay | Admitting: Student-PharmD

## 2022-12-22 NOTE — Telephone Encounter (Signed)
Called patient to set up 3 month follow up visit in DVT Clinic. This is scheduled for June 12th at 2:30pm.

## 2022-12-25 ENCOUNTER — Other Ambulatory Visit: Payer: Self-pay | Admitting: Student-PharmD

## 2023-01-19 ENCOUNTER — Ambulatory Visit (HOSPITAL_COMMUNITY)
Admission: RE | Admit: 2023-01-19 | Discharge: 2023-01-19 | Disposition: A | Discharge status: Home / Self Care | Payer: 59 | Source: Ambulatory Visit | Attending: Vascular Surgery | Admitting: Vascular Surgery

## 2023-01-19 ENCOUNTER — Other Ambulatory Visit (HOSPITAL_COMMUNITY): Payer: Self-pay | Admitting: Orthopaedic Surgery

## 2023-01-19 DIAGNOSIS — I82452 Acute embolism and thrombosis of left peroneal vein: Secondary | ICD-10-CM

## 2023-01-20 ENCOUNTER — Ambulatory Visit (HOSPITAL_COMMUNITY): Payer: 59

## 2023-01-21 ENCOUNTER — Ambulatory Visit (HOSPITAL_COMMUNITY)
Admission: RE | Admit: 2023-01-21 | Discharge: 2023-01-21 | Disposition: A | Discharge status: Home / Self Care | Payer: 59 | Source: Ambulatory Visit | Attending: Vascular Surgery | Admitting: Vascular Surgery

## 2023-01-21 ENCOUNTER — Encounter (HOSPITAL_COMMUNITY): Payer: Self-pay | Admitting: Student-PharmD

## 2023-01-21 VITALS — BP 120/98 | HR 77

## 2023-01-21 DIAGNOSIS — I82452 Acute embolism and thrombosis of left peroneal vein: Secondary | ICD-10-CM | POA: Diagnosis present
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
## 2023-01-21 NOTE — Patient Instructions (Signed)
You have been discharged from the DVT Clinic! No further follow up in the DVT Clinic is needed.  -Finish your current supply of Eliquis, then discontinue treatment. This will complete 3 months of anticoagulation.  -Your doppler showed no residual blood clots in your leg.   Please reach out if any questions come up. [phone] [facility].

## 2023-02-07 ENCOUNTER — Other Ambulatory Visit: Payer: Self-pay | Admitting: Family Medicine

## 2023-04-26 ENCOUNTER — Ambulatory Visit (INDEPENDENT_AMBULATORY_CARE_PROVIDER_SITE_OTHER): Payer: 59 | Admitting: Family Medicine

## 2023-04-26 ENCOUNTER — Encounter: Payer: Self-pay | Admitting: Family Medicine

## 2023-04-26 VITALS — BP 136/82 | HR 80 | Temp 98.2°F | Ht 69.0 in | Wt 201.0 lb

## 2023-04-26 DIAGNOSIS — Z23 Encounter for immunization: Secondary | ICD-10-CM

## 2023-04-26 DIAGNOSIS — M25462 Effusion, left knee: Secondary | ICD-10-CM | POA: Diagnosis not present

## 2023-04-26 DIAGNOSIS — M25562 Pain in left knee: Secondary | ICD-10-CM
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
## 2023-04-26 NOTE — Patient Instructions (Signed)
You have an appointment at [facility] at 9:30am tomorrow In the meantime, Ibuprofen 600mg  (3 pills) every 8 hrs.  Take w/ food ICE!!! Elevate as much as possible Call with any questions or concerns Hang in there!!!

## 2023-04-27 ENCOUNTER — Ambulatory Visit (HOSPITAL_COMMUNITY)
Admission: RE | Admit: 2023-04-27 | Discharge: 2023-04-27 | Disposition: A | Discharge status: Home / Self Care | Payer: 59 | Source: Ambulatory Visit | Attending: Cardiology | Admitting: Cardiology

## 2023-04-27 ENCOUNTER — Other Ambulatory Visit (HOSPITAL_COMMUNITY): Payer: Self-pay | Admitting: Orthopedic Surgery

## 2023-04-27 DIAGNOSIS — Z86718 Personal history of other venous thrombosis and embolism: Secondary | ICD-10-CM

## 2023-05-04 ENCOUNTER — Other Ambulatory Visit: Payer: Self-pay | Admitting: Family Medicine

## 2023-05-05 ENCOUNTER — Other Ambulatory Visit: Payer: Self-pay | Admitting: Family Medicine

## 2023-07-24 ENCOUNTER — Other Ambulatory Visit: Payer: Self-pay | Admitting: Family Medicine

## 2023-08-26 ENCOUNTER — Other Ambulatory Visit: Payer: Self-pay | Admitting: Family Medicine

## 2023-09-23 ENCOUNTER — Emergency Department (HOSPITAL_BASED_OUTPATIENT_CLINIC_OR_DEPARTMENT_OTHER): Payer: 59

## 2023-09-23 ENCOUNTER — Other Ambulatory Visit: Payer: Self-pay

## 2023-09-23 ENCOUNTER — Emergency Department (HOSPITAL_BASED_OUTPATIENT_CLINIC_OR_DEPARTMENT_OTHER)
Admission: EM | Admit: 2023-09-23 | Discharge: 2023-09-24 | Disposition: A | Discharge status: Home / Self Care | Payer: 59 | Attending: Emergency Medicine | Admitting: Emergency Medicine

## 2023-09-23 ENCOUNTER — Encounter (HOSPITAL_BASED_OUTPATIENT_CLINIC_OR_DEPARTMENT_OTHER): Payer: Self-pay | Admitting: Emergency Medicine

## 2023-09-23 DIAGNOSIS — J45909 Unspecified asthma, uncomplicated: Secondary | ICD-10-CM | POA: Insufficient documentation

## 2023-09-23 DIAGNOSIS — W228XXA Striking against or struck by other objects, initial encounter: Secondary | ICD-10-CM | POA: Diagnosis not present

## 2023-09-23 DIAGNOSIS — I10 Essential (primary) hypertension: Secondary | ICD-10-CM | POA: Diagnosis not present

## 2023-09-23 DIAGNOSIS — S8002XA Contusion of left knee, initial encounter: Secondary | ICD-10-CM | POA: Diagnosis present

## 2023-09-23 DIAGNOSIS — M7989 Other specified soft tissue disorders: Secondary | ICD-10-CM | POA: Diagnosis not present

## 2023-09-23 DIAGNOSIS — Z79899 Other long term (current) drug therapy: Secondary | ICD-10-CM | POA: Diagnosis not present

## 2023-09-23 DIAGNOSIS — N289 Disorder of kidney and ureter, unspecified: Secondary | ICD-10-CM | POA: Diagnosis not present

## 2023-09-23 DIAGNOSIS — Z7951 Long term (current) use of inhaled steroids: Secondary | ICD-10-CM | POA: Insufficient documentation

## 2023-09-23 LAB — CBC WITH DIFFERENTIAL/PLATELET
Abs Immature Granulocytes: 0.04 10*3/uL (ref 0.00–0.07)
Basophils Absolute: 0.1 10*3/uL (ref 0.0–0.1)
Basophils Relative: 1 %
Eosinophils Absolute: 0.9 10*3/uL — ABNORMAL HIGH (ref 0.0–0.5)
Eosinophils Relative: 9 %
HCT: 41.4 % (ref 39.0–52.0)
Hemoglobin: 14.1 g/dL (ref 13.0–17.0)
Immature Granulocytes: 0 %
Lymphocytes Relative: 17 %
Lymphs Abs: 1.7 10*3/uL (ref 0.7–4.0)
MCH: 29.4 pg (ref 26.0–34.0)
MCHC: 34.1 g/dL (ref 30.0–36.0)
MCV: 86.3 fL (ref 80.0–100.0)
Monocytes Absolute: 0.9 10*3/uL (ref 0.1–1.0)
Monocytes Relative: 9 %
Neutro Abs: 6.5 10*3/uL (ref 1.7–7.7)
Neutrophils Relative %: 64 %
Platelets: 260 10*3/uL (ref 150–400)
RBC: 4.8 MIL/uL (ref 4.22–5.81)
RDW: 12.3 % (ref 11.5–15.5)
WBC: 10 10*3/uL (ref 4.0–10.5)
nRBC: 0 % (ref 0.0–0.2)
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
## 2023-09-24 ENCOUNTER — Emergency Department (HOSPITAL_BASED_OUTPATIENT_CLINIC_OR_DEPARTMENT_OTHER): Payer: 59

## 2023-09-24 LAB — BASIC METABOLIC PANEL
Anion gap: 7 (ref 5–15)
BUN: 14 mg/dL (ref 6–20)
CO2: 26 mmol/L (ref 22–32)
Calcium: 9.1 mg/dL (ref 8.9–10.3)
Chloride: 107 mmol/L (ref 98–111)
Creatinine, Ser: 1.26 mg/dL — ABNORMAL HIGH (ref 0.61–1.24)
GFR, Estimated: >60 mL/min (ref >60)
Glucose, Bld: 103 mg/dL — ABNORMAL HIGH (ref 70–99)
Potassium: 3.9 mmol/L (ref 3.5–5.1)
Sodium: 140 mmol/L (ref 135–145)

## 2023-09-24 NOTE — ED Provider Notes (Signed)
[facility] Provider Note  CSN: [id number] Arrival date & time: 09/23/23 2235  Chief Complaint(s) Leg Swelling  HPI Alexander Hernandez is a 48 y.o. male here with left knee pain and swelling to the left leg.  He reports hitting his knee 5 days ago and having pain and swelling since.  Patient has been ambulating well however pain persists and swelling developed.  He does have a history of prior DVTs in that extremity.  He denies any other injuries or falls.  The history is provided by the patient.    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 Authorizing Provider  albuterol (PROVENTIL HFA) 108 (90 Base) MCG/ACT inhaler Inhale 2 puffs into the lungs every 4 (four) hours as needed for wheezing or shortness of breath. 05/08/22   Padgett, Pilar Grammes, MD  amoxicillin (AMOXIL) 250 MG capsule Take 250  mg by mouth 3 (three) times daily. 01/14/23   [provider]  Ascorbic Acid (VITAMIN C) 1000 MG tablet Take 1,000 mg by mouth daily.    [provider]  buPROPion (WELLBUTRIN XL) 150 MG 24 hr tablet Take 1 tablet by mouth once daily 07/26/23   Sheliah Hatch, MD  calcium carbonate (OSCAL) 1500 (600 Ca) MG TABS tablet Take 600 mg of elemental calcium by mouth daily with breakfast.    [provider]  Carbinoxamine Maleate ER [facility] ER) 4 MG/5ML SUER Take 8 mg by mouth 2 (two) times daily. 10/29/22   Sheliah Hatch, MD  Cholecalciferol (VITAMIN D) 2000 units CAPS Take 1 capsule by mouth daily.    [provider]  fluticasone (FLONASE) 50 MCG/ACT nasal spray Place 1 spray into both nostrils daily. 10/29/22   Sheliah Hatch, MD  fluticasone-salmeterol (ADVAIR) 100-50 MCG/ACT AEPB Inhale 1 puff into the lungs 2 (two) times daily. 09/09/22   Marcelyn Bruins, MD  ibuprofen (ADVIL) 800 MG tablet Take 800 mg by mouth every 8 (eight) hours as needed for moderate pain or mild pain.    [provider]  magnesium gluconate (MAGONATE) 500 MG tablet Take 500 mg by mouth 2 (two) times daily.    [provider]  montelukast (SINGULAIR) 10 MG tablet TAKE 1 TABLET BY MOUTH ONCE DAILY AT BEDTIME 08/26/23   Sheliah Hatch, MD  pseudoephedrine (SUDAFED 12 HOUR) 120 MG 12 hr tablet Take 1 tablet (120  mg total) by mouth 2 (two) times daily as needed (severe congestion/sinus pressure). 06/26/21   Marcelyn Bruins, MD  solifenacin (VESICARE) 10 MG tablet Take 10 mg by mouth daily. 08/07/20   [provider]  tamsulosin (FLOMAX) 0.4 MG CAPS capsule Take 0.4 mg daily by mouth.     [provider]  valACYclovir (VALTREX) 1000 MG tablet TAKE ONE TABLET BY MOUTH ONE TIME DAILY 11/27/22   Sheliah Hatch, MD  vitamin k 100 MCG tablet Take 100 mcg by mouth daily.    [provider]                                                                                                                                     Allergies Codeine and Other  Review of Systems Review of Systems As noted in HPI  Physical Exam Vital Signs  I have reviewed the triage vital signs BP (!) 123/91 (BP Location: Right Arm)   Pulse 95   Temp 98 F (36.7 C) (Oral)   Resp 16   Ht 5\' 9"  (1.753 m)   Wt 93 kg   SpO2 95%   BMI 30.27 kg/m   Physical Exam Vitals reviewed.  Constitutional:      General: He is not in acute distress.    Appearance: He is well-developed. He is not diaphoretic.  HENT:     Head: Normocephalic and atraumatic.     Right Ear: External ear normal.     Left Ear: External ear normal.     Nose: Nose normal.     Mouth/Throat:     Mouth: Mucous membranes are moist.  Eyes:     General: No scleral icterus.    Conjunctiva/sclera: Conjunctivae normal.  Neck:     Trachea: Phonation normal.  Cardiovascular:     Rate and Rhythm: Normal rate and regular rhythm.  Pulmonary:     Effort: Pulmonary effort is normal. No respiratory distress.     Breath sounds: No stridor.  Abdominal:     General: There is no distension.  Musculoskeletal:        General: Normal range of motion.     Cervical back: Normal range of motion.     Right knee: No tenderness. LCL laxity present. No MCL laxity or ACL laxity.     Left knee: Swelling and ecchymosis present. No deformity or erythema. Normal range of motion. Tenderness present over the medial joint line. No lateral joint line tenderness. LCL laxity present. No MCL laxity or ACL laxity.      Legs:  Neurological:     Mental Status: He is alert and oriented to person, place, and time.  Psychiatric:        Behavior: Behavior normal.     ED Results and Treatments Labs (all labs ordered are listed, but only abnormal results are displayed) Labs Reviewed  CBC WITH DIFFERENTIAL/PLATELET - Abnormal; Notable for the  following components:      Result Value   Eosinophils  Absolute 0.9 (*)    All other components within normal limits  BASIC METABOLIC PANEL - Abnormal; Notable for the following components:   Glucose, Bld 103 (*)    Creatinine, Ser 1.26 (*)    All other components within normal limits                                                                                                                         EKG  EKG Interpretation Date/Time:    Ventricular Rate:    PR Interval:    QRS Duration:    QT Interval:    QTC Calculation:   R Axis:      Text Interpretation:         Radiology DG Knee Complete 4 Views Left Result Date: 09/24/2023 CLINICAL DATA:  Left knee pain EXAM: LEFT KNEE - COMPLETE 4+ VIEW COMPARISON:  None Available. FINDINGS: No evidence of fracture, dislocation, or joint effusion. No evidence of arthropathy or other focal bone abnormality. Moderate prepatellar soft tissue swelling. IMPRESSION: 1. Moderate prepatellar soft tissue swelling. No fracture or dislocation. Electronically Signed   By: Helyn Numbers M.D.   On: 09/24/2023 00:55   US Venous Img Lower Unilateral Left (DVT) Result Date: 09/23/2023 CLINICAL DATA:  Left leg swelling history of calf DVT EXAM: Left LOWER EXTREMITY VENOUS DOPPLER ULTRASOUND TECHNIQUE: Gray-scale sonography with compression, as well as color and duplex ultrasound, were performed to evaluate the deep venous system(s) from the level of the common femoral vein through the popliteal and proximal calf veins. COMPARISON:  None Available. FINDINGS: VENOUS Normal compressibility of the common femoral, superficial femoral, and popliteal veins, as well as the visualized calf veins. Visualized portions of profunda femoral vein and great saphenous vein unremarkable. No filling defects to suggest DVT on grayscale or color Doppler imaging. Doppler waveforms show normal direction of venous flow, normal respiratory plasticity and response to augmentation. Limited views of the contralateral common femoral vein are  unremarkable. OTHER None. Limitations: none IMPRESSION: Negative. Electronically Signed   By: Jasmine Pang M.D.   On: 09/23/2023 23:46    Medications Ordered in ED Medications - No data to display Procedures Procedures  (including critical care time) Medical Decision Making / ED Course   Medical Decision Making Amount and/or Complexity of Data Reviewed Labs: ordered. Decision-making details documented in ED Course. Radiology: ordered and independent interpretation performed. Decision-making details documented in ED Course.    Left knee pain with leg swelling.  Differential diagnosis considered and workup below Ultrasound negative for DVT X-ray negative for acute fracture or dislocation.  Soft tissue swelling noted.  Possible soft tissue contusion versus tendon/ligamentous injury. Labs reassuring without leukocytosis or anemia.  Metabolic panel with mild renal sufficiency without AKI. Recommended supportive management. Patient already established with orthopedic doctor.  Close follow-up recommended    Final Clinical Impression(s) / ED Diagnoses Final diagnoses:  Contusion of left knee,  initial encounter   The patient appears reasonably screened and/or stabilized for discharge and I doubt any other medical condition or other [facility] requiring further screening, evaluation, or treatment in the ED at this time. I have discussed the findings, Dx and Tx plan with the patient/family who expressed understanding and agree(s) with the plan. Discharge instructions discussed at length. The patient/family was given strict return precautions who verbalized understanding of the instructions. No further questions at time of discharge.  Disposition: Discharge  Condition: Good  ED Discharge Orders     None         Follow Up: Sheliah Hatch, MD [address] [place] [address] [phone]  Call  as needed  Loreta Ave, MD  Call  to schedule an appointment for  close follow up    This chart was dictated using voice recognition software.  Despite best efforts to proofread,  errors can occur which can change the documentation meaning.    Nira Conn, MD 09/24/23 [phone]

## 2023-10-28 ENCOUNTER — Other Ambulatory Visit: Payer: Self-pay | Admitting: Family Medicine

## 2023-11-27 ENCOUNTER — Other Ambulatory Visit: Payer: Self-pay | Admitting: Family Medicine

## 2023-12-22 ENCOUNTER — Encounter: Payer: 59 | Admitting: Family Medicine

## 2023-12-27 ENCOUNTER — Encounter: Payer: Self-pay | Admitting: Family Medicine

## 2023-12-27 ENCOUNTER — Ambulatory Visit (INDEPENDENT_AMBULATORY_CARE_PROVIDER_SITE_OTHER): Admitting: Family Medicine

## 2023-12-27 VITALS — BP 112/74 | HR 82 | Temp 98.0°F | Ht 69.0 in | Wt 210.1 lb

## 2023-12-27 DIAGNOSIS — R0781 Pleurodynia: Secondary | ICD-10-CM | POA: Diagnosis not present

## 2023-12-27 DIAGNOSIS — E559 Vitamin D deficiency, unspecified: Secondary | ICD-10-CM

## 2023-12-27 DIAGNOSIS — Z1211 Encounter for screening for malignant neoplasm of colon: Secondary | ICD-10-CM | POA: Diagnosis not present

## 2023-12-27 DIAGNOSIS — E669 Obesity, unspecified: Secondary | ICD-10-CM | POA: Diagnosis not present

## 2023-12-27 DIAGNOSIS — Z0000 Encounter for general adult medical examination without abnormal findings: Secondary | ICD-10-CM

## 2023-12-27 DIAGNOSIS — Z125 Encounter for screening for malignant neoplasm of prostate: Secondary | ICD-10-CM | POA: Diagnosis not present

## 2023-12-27 MED ORDER — VALACYCLOVIR HCL 1 G PO TABS: 1000.0000 mg | ORAL_TABLET | Freq: Every day | ORAL | 3 refills | Status: AC

## 2023-12-27 NOTE — Patient Instructions (Signed)
 Follow up in 1 year or as needed We'll notify you of your lab results and make any changes if needed Continue to work on healthy diet and regular exercise- you can do it! Go to the [facility] and get your xray done- no appt needed We'll call you to schedule your colonoscopy consultation Call with any questions or concerns Stay Safe!  Stay Healthy! Hang in there!!!

## 2023-12-27 NOTE — Progress Notes (Signed)
   Subjective:    Patient ID: Alexander Hernandez, male    DOB: 03-09-1976, 48 y.o.   MRN: [id number]  HPI CPE- due for colonoscopy.  Patient Care Team    Relationship Specialty Notifications Start End  Jess Morita, MD PCP - General Family Medicine  06/22/11   Eduardo Grade, MD Referring Physician Dermatology  12/27/23      Health Maintenance  Topic Date Due   Pneumococcal Vaccine 60-9 Years old (1 of 2 - PCV) Never done   Colonoscopy  Never done   COVID-19 Vaccine (2 - 2024-25 season) 04/11/2023   Zoster Vaccines- Shingrix (1 of 2) Never done   DTaP/Tdap/Td (4 - Td or Tdap) 01/28/2024   INFLUENZA VACCINE  03/10/2024   Hepatitis C Screening  Completed   HIV Screening  Completed   HPV VACCINES  Aged Out   Meningococcal B Vaccine  Aged Out      Review of Systems Patient reports no vision/hearing changes, anorexia, fever ,adenopathy, persistant/recurrent hoarseness, swallowing issues, chest pain, palpitations, persistant/recurrent cough, hemoptysis, dyspnea (rest,exertional, paroxysmal nocturnal), gastrointestinal  bleeding (melena, rectal bleeding), abdominal pain, excessive heart burn, GU symptoms (dysuria, hematuria, voiding/incontinence issues) syncope, focal weakness, memory loss, numbness & tingling, skin/hair/nail changes, depression, anxiety, abnormal bruising/bleeding.   + R rib pain- pt is very active (works on a farm), states he has pain but doesn't recall a specific injury.  Unable to lie on his side to sleep at night due to pain.    Objective:   Physical Exam General Appearance:    Alert, cooperative, no distress, appears stated age  Head:    Normocephalic, without obvious abnormality, atraumatic  Eyes:    PERRL, conjunctiva/corneas clear, EOM's intact both eyes       Ears:    Normal TM's and external ear canals, both ears  Nose:   Nares normal, septum midline, mucosa normal, no drainage   or sinus tenderness  Throat:   Lips, mucosa, and tongue normal; teeth  and gums normal  Neck:   Supple, symmetrical, trachea midline, no adenopathy;       thyroid :  No enlargement/tenderness/nodules  Back:     Symmetric, no curvature, ROM normal, no CVA tenderness  Lungs:     Clear to auscultation bilaterally, respirations unlabored  Chest wall:    No tenderness or deformity  Heart:    Regular rate and rhythm, S1 and S2 normal, no murmur, rub   or gallop  Abdomen:     Soft, non-tender, bowel sounds active all four quadrants,    no masses, no organomegaly  Genitalia:    deferred  Rectal:    Extremities:   Extremities normal, atraumatic, no cyanosis or edema  Pulses:   2+ and symmetric all extremities  Skin:   Skin color, texture, turgor normal, no rashes or lesions  Lymph nodes:   Cervical, supraclavicular, and axillary nodes normal  Neurologic:   CNII-XII intact. Normal strength, sensation and reflexes      throughout          Assessment & Plan:  R rib pain- new.  Pt reports TTP over R lower ribs.  Also unable to sleep on his side due to discomfort.  Reports 'i've taken a few shots' but doesn't recall a specific injury responsible for pain.  Will get xrays to assess.  Pt expressed understanding and is in agreement w/ plan.

## 2023-12-28 ENCOUNTER — Ambulatory Visit: Payer: Self-pay | Admitting: Family Medicine

## 2023-12-28 LAB — LIPID PANEL
Cholesterol: 173 mg/dL (ref 0–200)
HDL: 39.2 mg/dL (ref >39.00)
LDL Cholesterol: 107 mg/dL — ABNORMAL HIGH (ref 0–99)
NonHDL: 134.12
Total CHOL/HDL Ratio: 4
Triglycerides: 135 mg/dL (ref 0.0–149.0)
VLDL: 27 mg/dL (ref 0.0–40.0)

## 2023-12-28 LAB — BASIC METABOLIC PANEL WITH GFR
BUN: 14 mg/dL (ref 6–23)
CO2: 30 meq/L (ref 19–32)
Calcium: 9.4 mg/dL (ref 8.4–10.5)
Chloride: 104 meq/L (ref 96–112)
Creatinine, Ser: 1.12 mg/dL (ref 0.40–1.50)
GFR: 76.79 mL/min (ref >60.00)
Glucose, Bld: 98 mg/dL (ref 70–99)
Potassium: 4.4 meq/L (ref 3.5–5.1)
Sodium: 143 meq/L (ref 135–145)

## 2023-12-28 LAB — CBC WITH DIFFERENTIAL/PLATELET
Basophils Absolute: 0.1 10*3/uL (ref 0.0–0.1)
Basophils Relative: 1.2 % (ref 0.0–3.0)
Eosinophils Absolute: 0.5 10*3/uL (ref 0.0–0.7)
Eosinophils Relative: 7.7 % — ABNORMAL HIGH (ref 0.0–5.0)
HCT: 43.6 % (ref 39.0–52.0)
Hemoglobin: 15 g/dL (ref 13.0–17.0)
Lymphocytes Relative: 20.1 % (ref 12.0–46.0)
Lymphs Abs: 1.2 10*3/uL (ref 0.7–4.0)
MCHC: 34.3 g/dL (ref 30.0–36.0)
MCV: 86.4 fl (ref 78.0–100.0)
Monocytes Absolute: 0.6 10*3/uL (ref 0.1–1.0)
Monocytes Relative: 9.6 % (ref 3.0–12.0)
Neutro Abs: 3.8 10*3/uL (ref 1.4–7.7)
Neutrophils Relative %: 61.4 % (ref 43.0–77.0)
Platelets: 242 10*3/uL (ref 150.0–400.0)
RBC: 5.05 Mil/uL (ref 4.22–5.81)
RDW: 13.1 % (ref 11.5–15.5)
WBC: 6.2 10*3/uL (ref 4.0–10.5)

## 2023-12-28 LAB — HEPATIC FUNCTION PANEL
ALT: 52 U/L (ref 0–53)
AST: 35 U/L (ref 0–37)
Albumin: 4.5 g/dL (ref 3.5–5.2)
Alkaline Phosphatase: 59 U/L (ref 39–117)
Bilirubin, Direct: 0.1 mg/dL (ref 0.0–0.3)
Total Bilirubin: 0.6 mg/dL (ref 0.2–1.2)
Total Protein: 7 g/dL (ref 6.0–8.3)

## 2023-12-28 LAB — PSA: PSA: 0.64 ng/mL (ref 0.10–4.00)

## 2023-12-28 LAB — TSH: TSH: 1.54 u[IU]/mL (ref 0.35–5.50)

## 2023-12-28 LAB — VITAMIN D 25 HYDROXY (VIT D DEFICIENCY, FRACTURES): VITD: 86.22 ng/mL (ref 30.00–100.00)

## 2023-12-28 NOTE — Assessment & Plan Note (Signed)
 Pt's PE WNL w/ exception of BMI.  UTD on Tdap.  Due for colonoscopy- referral placed.  Check labs.  Anticipatory guidance provided.

## 2024-01-24 ENCOUNTER — Encounter: Payer: Self-pay | Admitting: Dermatology

## 2024-01-28 ENCOUNTER — Encounter: Payer: Self-pay | Admitting: Family Medicine

## 2024-01-30 ENCOUNTER — Other Ambulatory Visit: Payer: Self-pay | Admitting: Family Medicine

## 2024-02-29 ENCOUNTER — Other Ambulatory Visit: Payer: Self-pay | Admitting: Family Medicine

## 2024-05-19 ENCOUNTER — Other Ambulatory Visit: Payer: Self-pay | Admitting: Family Medicine
